# Patient Record
Sex: Male | Born: 1970
Health system: Southern US, Community
[De-identification: ages and names within clinical notes are randomized; demographics above are authoritative.]

## PROBLEM LIST (undated history)

## (undated) DIAGNOSIS — I10 Essential (primary) hypertension: Secondary | ICD-10-CM

## (undated) DIAGNOSIS — G44009 Cluster headache syndrome, unspecified, not intractable: Secondary | ICD-10-CM

## (undated) DIAGNOSIS — K219 Gastro-esophageal reflux disease without esophagitis: Secondary | ICD-10-CM

## (undated) DIAGNOSIS — A6000 Herpesviral infection of urogenital system, unspecified: Secondary | ICD-10-CM

## (undated) HISTORY — PX: VASECTOMY: SHX75

---

## 2004-07-02 ENCOUNTER — Ambulatory Visit: Payer: Self-pay | Admitting: Internal Medicine

## 2004-07-06 ENCOUNTER — Encounter: Admission: RE | Admit: 2004-07-06 | Discharge: 2004-07-06 | Payer: Self-pay | Admitting: Internal Medicine

## 2005-08-12 ENCOUNTER — Ambulatory Visit: Payer: Self-pay | Admitting: Internal Medicine

## 2005-08-15 ENCOUNTER — Encounter: Admission: RE | Admit: 2005-08-15 | Discharge: 2005-08-15 | Payer: Self-pay | Admitting: Internal Medicine

## 2005-09-11 ENCOUNTER — Ambulatory Visit: Payer: Self-pay | Admitting: Internal Medicine

## 2005-10-14 ENCOUNTER — Ambulatory Visit: Payer: Self-pay | Admitting: Internal Medicine

## 2006-02-21 ENCOUNTER — Ambulatory Visit: Payer: Self-pay | Admitting: Internal Medicine

## 2007-06-11 ENCOUNTER — Encounter (INDEPENDENT_AMBULATORY_CARE_PROVIDER_SITE_OTHER): Payer: Self-pay | Admitting: *Deleted

## 2009-12-21 ENCOUNTER — Emergency Department (HOSPITAL_COMMUNITY): Admission: EM | Admit: 2009-12-21 | Discharge: 2009-12-22 | Payer: Self-pay | Admitting: Emergency Medicine

## 2010-10-23 ENCOUNTER — Emergency Department (HOSPITAL_COMMUNITY)
Admission: EM | Admit: 2010-10-23 | Discharge: 2010-10-23 | Disposition: A | Discharge status: Home / Self Care | Payer: 59 | Attending: Emergency Medicine | Admitting: Emergency Medicine

## 2010-10-23 DIAGNOSIS — R1013 Epigastric pain: Secondary | ICD-10-CM | POA: Insufficient documentation

## 2010-10-23 LAB — COMPREHENSIVE METABOLIC PANEL
AST: 10 U/L (ref 0–37)
BUN: 18 mg/dL (ref 6–23)
CO2: 29 mEq/L (ref 19–32)
Chloride: 105 mEq/L (ref 96–112)
Creatinine, Ser: 0.89 mg/dL (ref 0.50–1.35)
GFR calc non Af Amer: >60 mL/min (ref >60)
Glucose, Bld: 125 mg/dL — ABNORMAL HIGH (ref 70–99)
Total Bilirubin: 0.2 mg/dL — ABNORMAL LOW (ref 0.3–1.2)

## 2010-10-23 LAB — DIFFERENTIAL
Lymphocytes Relative: 33 % (ref 12–46)
Monocytes Absolute: 0.6 10*3/uL (ref 0.1–1.0)
Monocytes Relative: 8 % (ref 3–12)
Neutro Abs: 4 10*3/uL (ref 1.7–7.7)

## 2010-10-23 LAB — URINALYSIS, ROUTINE W REFLEX MICROSCOPIC
Bilirubin Urine: NEGATIVE
Hgb urine dipstick: NEGATIVE
Ketones, ur: NEGATIVE mg/dL
Specific Gravity, Urine: 1.026 (ref 1.005–1.030)
pH: 6 (ref 5.0–8.0)

## 2010-10-23 LAB — CBC
HCT: 38 % — ABNORMAL LOW (ref 39.0–52.0)
Hemoglobin: 12.9 g/dL — ABNORMAL LOW (ref 13.0–17.0)
MCH: 28.5 pg (ref 26.0–34.0)
MCHC: 33.9 g/dL (ref 30.0–36.0)
MCV: 83.9 fL (ref 78.0–100.0)

## 2011-04-03 ENCOUNTER — Emergency Department (HOSPITAL_COMMUNITY)
Admission: EM | Admit: 2011-04-03 | Discharge: 2011-04-03 | Disposition: A | Discharge status: Home / Self Care | Payer: 59 | Source: Home / Self Care

## 2011-04-03 ENCOUNTER — Encounter (HOSPITAL_COMMUNITY): Payer: Self-pay | Admitting: *Deleted

## 2011-04-03 DIAGNOSIS — M79672 Pain in left foot: Secondary | ICD-10-CM

## 2011-04-03 DIAGNOSIS — M79609 Pain in unspecified limb: Secondary | ICD-10-CM

## 2011-04-03 MED ORDER — DICLOFENAC SODIUM 75 MG PO TBEC: 75.0000 mg | DELAYED_RELEASE_TABLET | Freq: Two times a day (BID) | ORAL | Status: AC

## 2011-04-03 NOTE — Discharge Instructions (Signed)
Ice your foot 3-4 times a day for 15-20 minutes or more often as needed. If your foot pain is not improving in one week call Dr Audrie Lia office for follow up , or you could consider follow up with the podiatrist of your choice. Return as needed.

## 2011-04-03 NOTE — ED Provider Notes (Signed)
History     CSN: [id number]  Arrival date & time 04/03/11  0909   None     Chief Complaint  Patient presents with  . Foot Pain    (Consider location/radiation/quality/duration/timing/severity/associated sxs/prior treatment) HPI Comments: Pt presents with c/o Lt foot pain. Pain is on the dorsum of his foot up to the anterior ankle and is worse at the end of the day after working and being on his feet all day, and also worse with plantarflexion. He denies injury. He has not tried anything for the discomfort. He has no previous hx of same. He is not diabetic.    History reviewed. No pertinent past medical history.  History reviewed. No pertinent past surgical history.  History reviewed. No pertinent family history.  History  Substance Use Topics  . Smoking status: Current Everyday Smoker  . Smokeless tobacco: Not on file  . Alcohol Use: Yes      Review of Systems  Musculoskeletal: Negative for joint swelling and gait problem.  Skin: Negative for color change, rash and wound.  Neurological: Negative for numbness.    Allergies  Review of patient's allergies indicates no known allergies.  Home Medications   Current Outpatient Rx  Name Route Sig Dispense Refill  . DICLOFENAC SODIUM 75 MG PO TBEC Oral Take 1 tablet (75 mg total) by mouth 2 (two) times daily. 20 tablet 0    BP 168/90  Pulse 80  Temp(Src) 99 F (37.2 C) (Oral)  Resp 16  SpO2 100%  Physical Exam  Nursing note and vitals reviewed. Constitutional: He appears well-developed and well-nourished. No distress.  HENT:  Head: Normocephalic and atraumatic.  Cardiovascular:  Pulses:      Dorsalis pedis pulses are 2+ on the left side.       Posterior tibial pulses are 2+ on the left side.  Musculoskeletal:       Left ankle: Normal.       Left foot: He exhibits tenderness. He exhibits normal range of motion, no bony tenderness, no swelling, normal capillary refill, no crepitus, no deformity and no  laceration.       Feet:  Neurological: He is alert.  Skin: Skin is warm and dry.  Psychiatric: He has a normal mood and affect.    ED Course  Procedures (including critical care time)  Labs Reviewed - No data to display No results found.   1. Foot pain, left       MDM  Lt foot pain x 1 wk. No injury. On exam, appears to be tendon tenderness, and not metatarsal bony tenderness. Pain reproduced with passive and active plantar flexion also.         Melody Comas, [place] 04/03/11 1040

## 2011-04-03 NOTE — ED Notes (Signed)
Left foot pain anterior foot denies injury mild pain when walking increased pain at night when trying to sleep

## 2011-04-05 NOTE — ED Provider Notes (Signed)
Medical screening examination/treatment/procedure(s) were performed by non-physician practitioner and as supervising physician I was immediately available for consultation/collaboration.  Kiani Wurtzel M. MD   Lauralyn Shadowens M Malary Aylesworth, MD 04/05/11 2120 

## 2012-09-17 ENCOUNTER — Emergency Department (HOSPITAL_COMMUNITY): Payer: 59

## 2012-09-17 ENCOUNTER — Emergency Department (HOSPITAL_COMMUNITY)
Admission: EM | Admit: 2012-09-17 | Discharge: 2012-09-18 | Disposition: A | Discharge status: Home / Self Care | Payer: 59 | Attending: Emergency Medicine | Admitting: Emergency Medicine

## 2012-09-17 ENCOUNTER — Encounter (HOSPITAL_COMMUNITY): Payer: Self-pay | Admitting: Adult Health

## 2012-09-17 DIAGNOSIS — N492 Inflammatory disorders of scrotum: Secondary | ICD-10-CM

## 2012-09-17 DIAGNOSIS — F17200 Nicotine dependence, unspecified, uncomplicated: Secondary | ICD-10-CM | POA: Insufficient documentation

## 2012-09-17 DIAGNOSIS — N498 Inflammatory disorders of other specified male genital organs: Secondary | ICD-10-CM | POA: Insufficient documentation

## 2012-09-17 NOTE — ED Provider Notes (Addendum)
CSN: [id number]     Arrival date & time 09/17/12  2044 History     First MD Initiated Contact with Patient 09/17/12 2336     Chief Complaint  Patient presents with  . Abscess   (Consider location/radiation/quality/duration/timing/severity/associated sxs/prior Treatment) HPI This is a 42 year old male with a history of scrotal abscesses. He is here with an abscess to his left proximal scrotum for about the past week. As of yesterday it was improving but worsened today. There is moderate to severe pain associated with it. The abscess was described as about the size of a golf ball on arrival. He was having an ultrasound of the scrotum as ordered in triage when the abscess began draining, relieving much of his discomfort as well as decrease in the size of the abscess.  History reviewed. No pertinent past medical history. History reviewed. No pertinent past surgical history. History reviewed. No pertinent family history. History  Substance Use Topics  . Smoking status: Current Every Day Smoker  . Smokeless tobacco: Not on file  . Alcohol Use: Yes    Review of Systems  All other systems reviewed and are negative.    Allergies  Review of patient's allergies indicates no known allergies.  Home Medications   Current Outpatient Rx  Name  Route  Sig  Dispense  Refill  . Aspirin-Salicylamide-Caffeine (BC HEADACHE POWDER PO)   Oral   Take 1 packet by mouth daily as needed (headache).          BP 194/101  Pulse 85  Temp(Src) 98.8 F (37.1 C) (Oral)  Resp 16  SpO2 98%  Physical Exam General: Well-developed, well-nourished male in no acute distress; appearance consistent with age of record HENT: normocephalic, atraumatic Eyes: pupils equal round and reactive to light; extraocular muscles intact Neck: supple Heart: regular rate and rhythm Lungs: clear to auscultation bilaterally Abdomen: soft; nondistended; nontender GU: Tanner 4 male; circumcised; abscess left proximal  scrotum with draining central punctum Extremities: No deformity; full range of motion; pulses normal Neurologic: Awake, alert and oriented; motor function intact in all extremities and symmetric; no facial droop Skin: Warm and dry Psychiatric: Normal mood and affect    ED Course   Procedures (including critical care time)  INCISION AND DRAINAGE Performed by: Paula Libra L Consent: Verbal consent obtained. Risks and benefits: risks, benefits and alternatives were discussed Type: abscess  Body area: Left hemiscrotum  Anesthesia: local infiltration  Incision was made with a scalpel.  Local anesthetic: lidocaine  2% with epinephrine  Anesthetic total: 0.5 ml  Complexity: complex Blunt dissection to break up loculations  Drainage: purulent  Drainage amount: Small   Packing material: 1/4 in iodoform gauze  Patient tolerance: Patient tolerated the procedure well with no immediate complications.   We'll treat with antibiotics as, per ultrasound, this is not a simple abscess.    MDM  Nursing notes and vitals signs, including pulse oximetry, reviewed.  Summary of this visit's results, reviewed by myself:  Imaging Studies: US Scrotum  09/17/2012   *RADIOLOGY REPORT*  Clinical Data: Scrotal abscess.  History of prior scrotal abscess.  ULTRASOUND OF SCROTUM  Technique:  Complete ultrasound examination of the testicles, epididymis, and other scrotal structures was performed.  Comparison:  08/15/2005.  Findings:  Right testis:  Normal echotexture.  40 mm x 24 mm x 26 mm.  Normal color flow.  Left testis:  Normal echotexture.  42 mm x 23 mm x 26 mm.  Normal color flow.  Right epididymis:  Tubal  ectasia.  Left epididymis:  Tubal ectasia.  Hydrocele:  Small left hydrocele is probably reactive.  Varicocele:  None.  There is a 2.8 cm x 0.8 cm x 1.9 cm subcutaneous abscess with surrounding phlegmon in the left anterior scrotum.  Surrounding scrotal wall edema.  By report, this abscess  may have ruptured following examination.  IMPRESSION: 1.  Normal appearance of the testicles and epididymis bilaterally. Small left hydrocele is probably reactive. 2.  Left scrotal abscess measuring 28 mm x 8 mm x 19 mm.   Original Report Authenticated By: Andreas Newport, M.D.      Hanley Seamen, MD 09/18/12 0000  Hanley Seamen, MD 09/18/12 1610

## 2012-09-17 NOTE — ED Notes (Signed)
Presents with induration to scrotum, began one week ago and was the size of a quarter, drainage noted yesterday of red and pink, today induration is the size of golf ball. Pt reports same symptoms before requiring surgery.

## 2012-09-18 MED ORDER — DOXYCYCLINE HYCLATE 100 MG IV SOLR
100.0000 mg | Freq: Once | INTRAVENOUS | Status: DC
  Filled 2012-09-18: qty 100

## 2012-09-18 MED ORDER — HYDROCODONE-ACETAMINOPHEN 5-325 MG PO TABS
1.0000 | ORAL_TABLET | Freq: Once | ORAL | Status: AC
  Administered 2012-09-18: 1 via ORAL
  Filled 2012-09-18: qty 1

## 2012-09-18 MED ORDER — HYDROCODONE-ACETAMINOPHEN 5-325 MG PO TABS: 1.0000 | ORAL_TABLET | ORAL | Status: DC | PRN

## 2012-09-18 MED ORDER — DOXYCYCLINE HYCLATE 100 MG PO CAPS: 100.0000 mg | ORAL_CAPSULE | Freq: Two times a day (BID) | ORAL | Status: DC

## 2012-09-18 MED ORDER — DOXYCYCLINE HYCLATE 100 MG PO TABS
100.0000 mg | ORAL_TABLET | Freq: Once | ORAL | Status: AC
  Administered 2012-09-18: 100 mg via ORAL
  Filled 2012-09-18: qty 1

## 2012-09-18 NOTE — ED Notes (Signed)
MD notified of bp. Instructed pt to follow up with PCP on Monday.

## 2013-01-25 ENCOUNTER — Emergency Department (HOSPITAL_COMMUNITY)
Admission: EM | Admit: 2013-01-25 | Discharge: 2013-01-25 | Disposition: A | Discharge status: Home / Self Care | Payer: 59 | Attending: Emergency Medicine | Admitting: Emergency Medicine

## 2013-01-25 ENCOUNTER — Emergency Department (HOSPITAL_COMMUNITY): Payer: 59

## 2013-01-25 ENCOUNTER — Encounter (HOSPITAL_COMMUNITY): Payer: Self-pay | Admitting: Emergency Medicine

## 2013-01-25 DIAGNOSIS — F17200 Nicotine dependence, unspecified, uncomplicated: Secondary | ICD-10-CM | POA: Insufficient documentation

## 2013-01-25 DIAGNOSIS — R0789 Other chest pain: Secondary | ICD-10-CM | POA: Diagnosis present

## 2013-01-25 LAB — CBC
MCV: 85.4 fL (ref 78.0–100.0)
Platelets: 256 10*3/uL (ref 150–400)
RDW: 13.9 % (ref 11.5–15.5)
WBC: 9 10*3/uL (ref 4.0–10.5)

## 2013-01-25 LAB — BASIC METABOLIC PANEL
Calcium: 9.1 mg/dL (ref 8.4–10.5)
Chloride: 101 mEq/L (ref 96–112)
Creatinine, Ser: 1.06 mg/dL (ref 0.50–1.35)
GFR calc Af Amer: >90 mL/min (ref >90)

## 2013-01-25 LAB — POCT I-STAT TROPONIN I: Troponin i, poc: 0.01 ng/mL (ref 0.00–0.08)

## 2013-01-25 IMAGING — CR DG CHEST 2V
2 series · 2 of 2 positions shown · non-contrast
Comparison: None.

CLINICAL DATA: Left chest pain

EXAM:
CHEST  2 VIEW

[w chest pa]
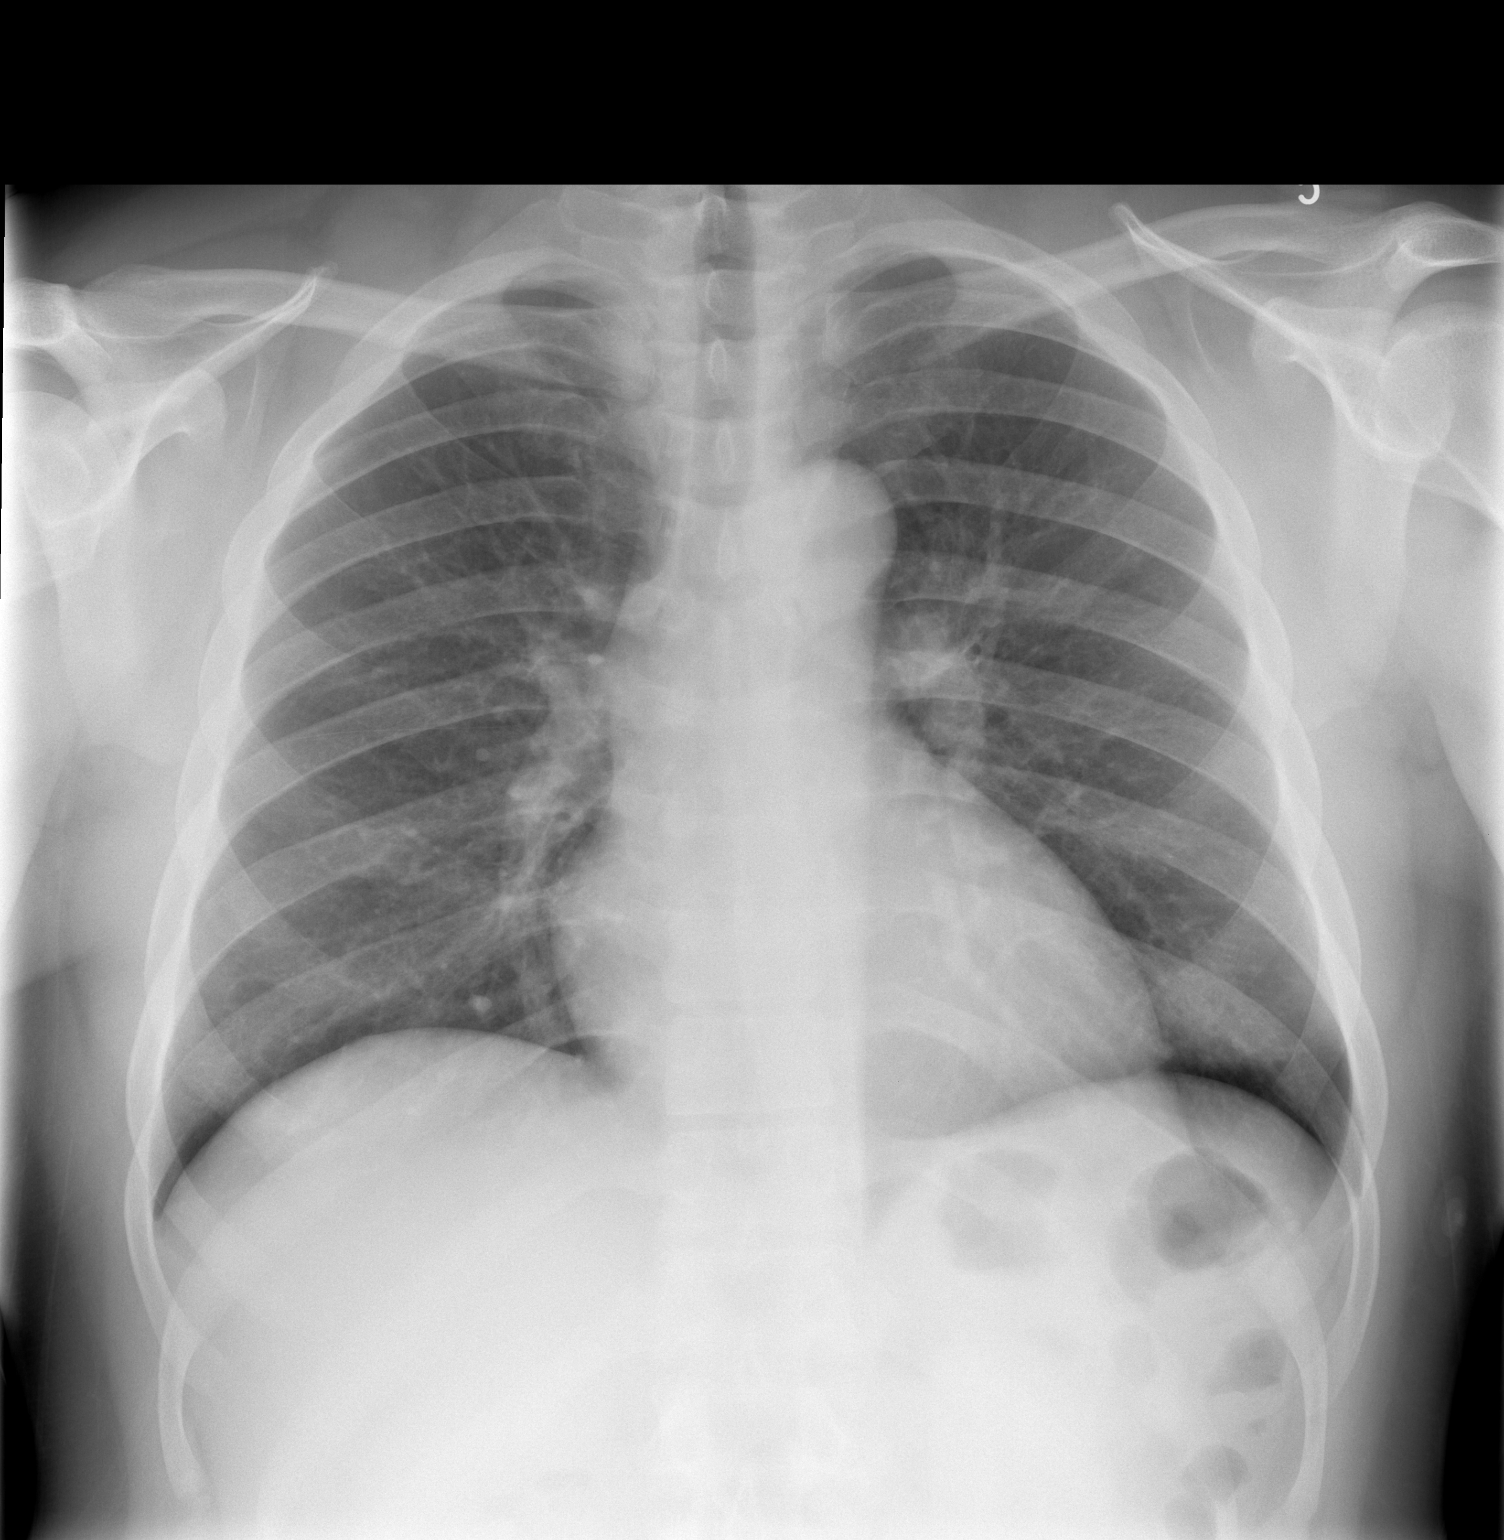

[w chest lat]
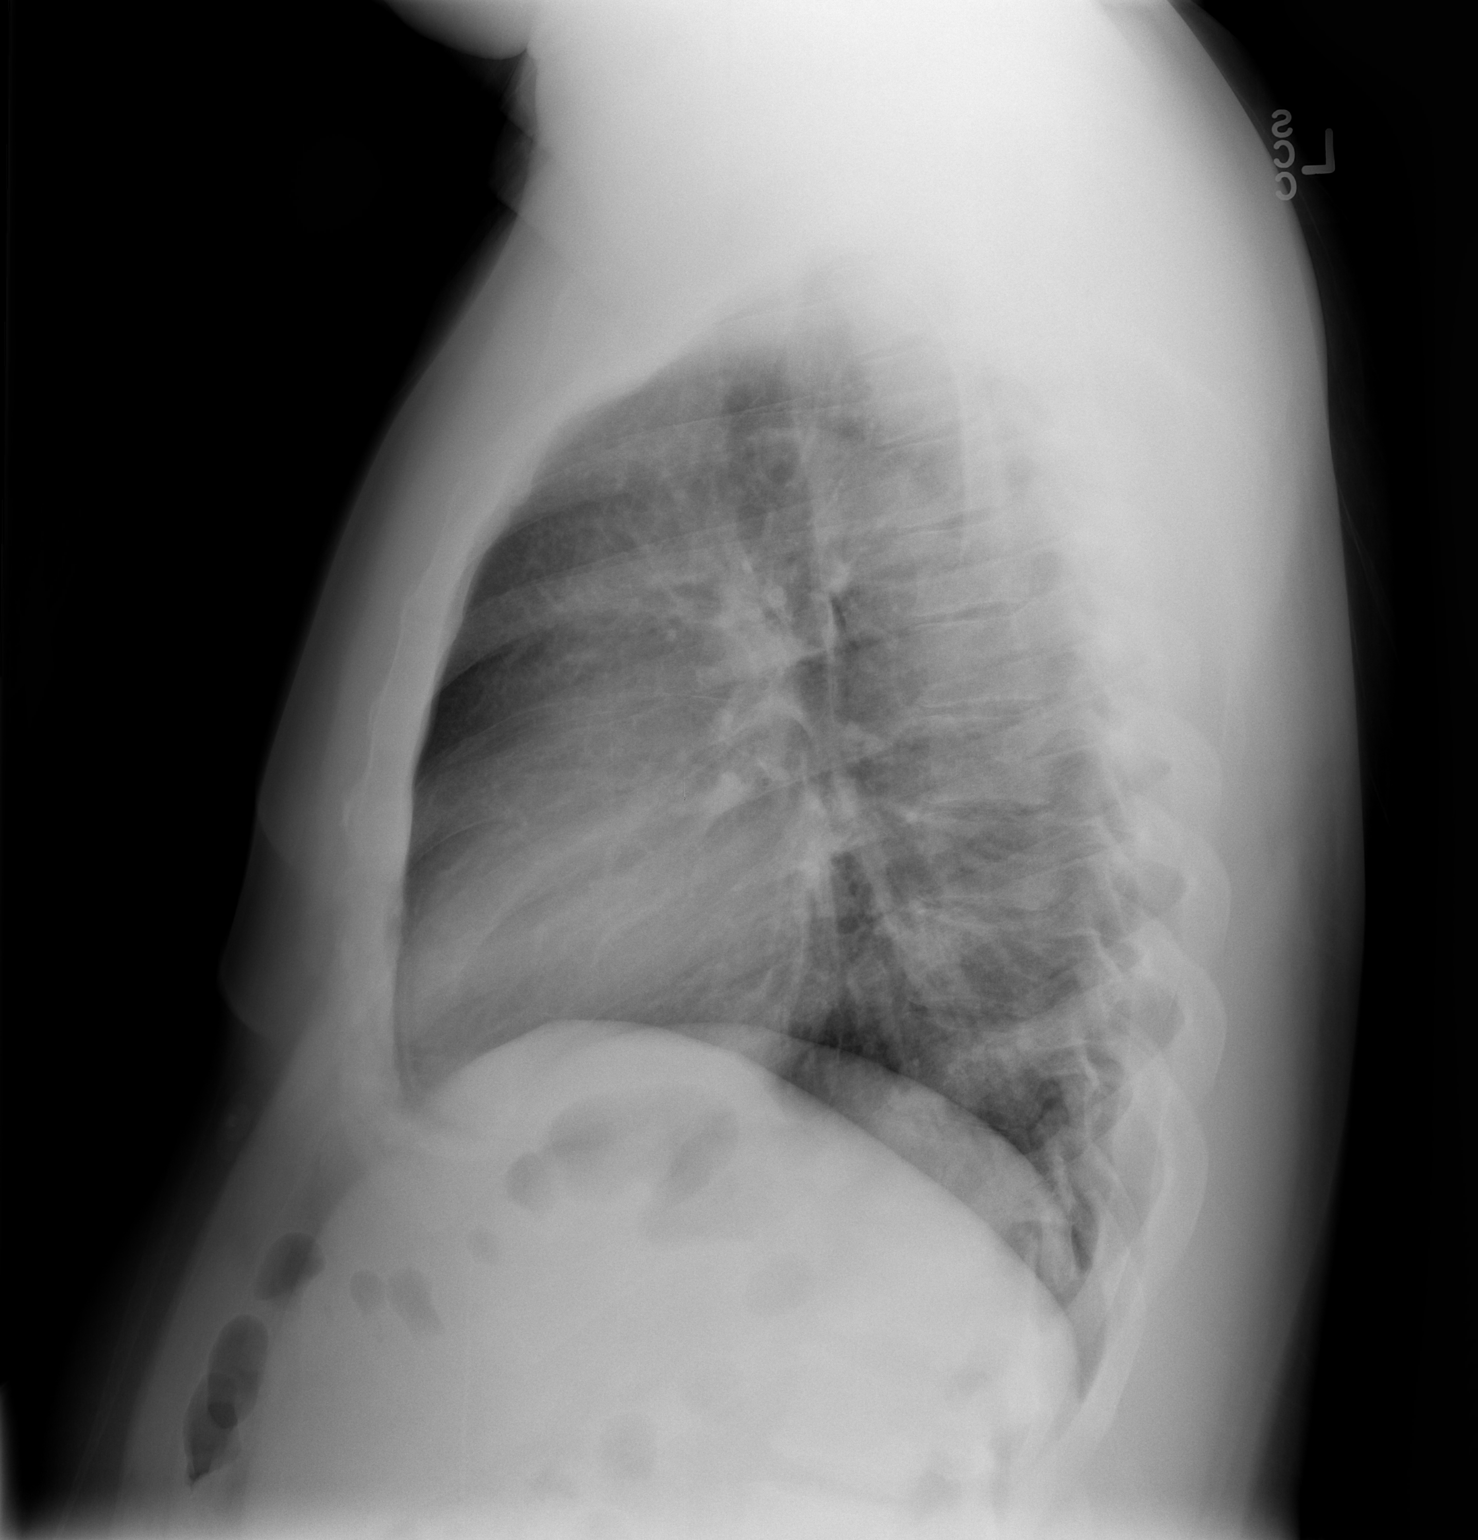

[2 of 2 positions shown; findings below may reference images not displayed]

FINDINGS: The heart size and mediastinal contours are within normal limits.
Both lungs are clear. The visualized skeletal structures are
unremarkable.
IMPRESSION: No active cardiopulmonary disease.

## 2013-01-25 MED ORDER — HYDROCHLOROTHIAZIDE 12.5 MG PO CAPS
12.5000 mg | ORAL_CAPSULE | ORAL | Status: AC
  Administered 2013-01-25: 12.5 mg via ORAL
  Filled 2013-01-25: qty 1

## 2013-01-25 MED ORDER — HYDROCHLOROTHIAZIDE 25 MG PO TABS: 12.5000 mg | ORAL_TABLET | Freq: Every day | ORAL | Status: DC

## 2013-01-25 NOTE — ED Provider Notes (Signed)
CSN: [id number]     Arrival date & time 01/25/13  1836 History   First MD Initiated Contact with Patient 01/25/13 1923     Chief Complaint  Patient presents with  . Chest Pain   (Consider location/radiation/quality/duration/timing/severity/associated sxs/prior Treatment) Patient is a 42 y.o. male presenting with chest pain. The history is provided by the patient.  Chest Pain Pain location:  L chest Pain quality: sharp and tightness   Pain radiates to:  Does not radiate Pain radiates to the back: no   Pain severity:  Mild Onset quality:  Sudden Duration:  10 minutes Timing:  Constant Progression:  Resolved Chronicity:  New Context: at rest   Relieved by:  Nothing Worsened by:  Nothing tried Ineffective treatments:  None tried Associated symptoms: no abdominal pain, no cough, no fever, no headache, no nausea, no numbness, no shortness of breath and not vomiting     History reviewed. No pertinent past medical history. History reviewed. No pertinent past surgical history. No family history on file. History  Substance Use Topics  . Smoking status: Current Every Day Smoker  . Smokeless tobacco: Not on file  . Alcohol Use: Yes    Review of Systems  Constitutional: Negative for fever.  HENT: Negative for drooling and rhinorrhea.   Eyes: Negative for pain.  Respiratory: Negative for cough and shortness of breath.   Cardiovascular: Positive for chest pain. Negative for leg swelling.  Gastrointestinal: Negative for nausea, vomiting, abdominal pain and diarrhea.  Genitourinary: Negative for dysuria and hematuria.  Musculoskeletal: Negative for gait problem and neck pain.  Skin: Negative for color change.  Neurological: Negative for numbness and headaches.  Hematological: Negative for adenopathy.  Psychiatric/Behavioral: Negative for behavioral problems.  All other systems reviewed and are negative.    Allergies  Review of patient's allergies indicates no known  allergies.  Home Medications   Current Outpatient Rx  Name  Route  Sig  Dispense  Refill  . valACYclovir (VALTREX) 500 MG tablet   Oral   Take 500 mg by mouth daily as needed.          BP 196/131  Pulse 74  Temp(Src) 98.1 F (36.7 C) (Oral)  Resp 20  Ht 6' (1.829 m)  Wt 249 lb 9 oz (113.201 kg)  BMI 33.84 kg/m2  SpO2 98% Physical Exam  Nursing note and vitals reviewed. Constitutional: He is oriented to person, place, and time. He appears well-developed and well-nourished.  HENT:  Head: Normocephalic and atraumatic.  Right Ear: External ear normal.  Left Ear: External ear normal.  Nose: Nose normal.  Mouth/Throat: Oropharynx is clear and moist. No oropharyngeal exudate.  Eyes: Conjunctivae and EOM are normal. Pupils are equal, round, and reactive to light.  Neck: Normal range of motion. Neck supple.  Cardiovascular: Normal rate, regular rhythm, normal heart sounds and intact distal pulses.  Exam reveals no gallop and no friction rub.   No murmur heard. Pulmonary/Chest: Effort normal and breath sounds normal. No respiratory distress. He has no wheezes.  Abdominal: Soft. Bowel sounds are normal. He exhibits no distension. There is no tenderness. There is no rebound and no guarding.  Musculoskeletal: Normal range of motion. He exhibits no edema and no tenderness.  Neurological: He is alert and oriented to person, place, and time.  Skin: Skin is warm and dry.  Psychiatric: He has a normal mood and affect. His behavior is normal.    ED Course  Procedures (including critical care time) Labs Review Labs Reviewed  BASIC METABOLIC PANEL - Abnormal; Notable for the following:    GFR calc non Af Amer 86 (*)    All other components within normal limits  CBC  POCT I-STAT TROPONIN I   Imaging Review Dg Chest 2 View  01/25/2013   CLINICAL DATA:  Left chest pain  EXAM: CHEST  2 VIEW  COMPARISON:  None.  FINDINGS: The heart size and mediastinal contours are within normal limits.  Both lungs are clear. The visualized skeletal structures are unremarkable.  IMPRESSION: No active cardiopulmonary disease.   Electronically Signed   By: Ruel Favors M.D.   On: 01/25/2013 19:25    EKG Interpretation    Date/Time:  Monday January 25 2013 18:39:09 EST Ventricular Rate:  65 PR Interval:  142 QRS Duration: 86 QT Interval:  396 QTC Calculation: 411 R Axis:   92 Text Interpretation:  Normal sinus rhythm Rightward axis Non-specific t wave abnormalities Confirmed by Marilyn Wing  MD, Zakyla Tonche ([id number]) on 01/25/2013 8:13:16 PM            MDM   1. Atypical chest pain    8:15 PM 42 y.o. male who presents with left-sided chest tightness and sharp pains. He states that his symptoms began while driving his car at approximately 6 PM. His symptoms lasted 10 minutes and resolved spontaneously. He is afebrile and hypertensive on exam here. He is currently asymptomatic. He denies any history of exertional chest pain and states that he has had this similar pain once before. Wells/Perc neg. Atypical for cardiac. RF's include FH, smoker, HTN. Initial labs non-contrib, ecg w/ non-spec t wave changes. Pt is low risk for MACE per HEART score. Will start HCTZ to control BP and have pt f/u w/ pcp. Will give first dose here.   8:18 PM:  I have discussed the diagnosis/risks/treatment options with the patient and family and believe the pt to be eligible for discharge home to follow-up with pcp within the next week to discuss BP control and possible stress testing. We also discussed returning to the ED immediately if new or worsening sx occur. We discussed the sx which are most concerning (e.g., return of cp, sob, fever) that necessitate immediate return. Any new prescriptions provided to the patient are listed below.  Discharge Medication List as of 01/25/2013  8:37 PM    START taking these medications   Details  hydrochlorothiazide (HYDRODIURIL) 25 MG tablet Take 0.5 tablets (12.5 mg total) by mouth  daily., Starting 01/25/2013, Until Discontinued, Print           Junius Argyle, MD 01/26/13 1359

## 2013-01-25 NOTE — ED Notes (Signed)
Pt. Alert and oriented x4. Denies chest pain at this time.

## 2013-01-25 NOTE — ED Notes (Signed)
Pt states had sudden onset of chest pain to left without radiation while at rest.  No sob or diaphoretic and lasted only 10 minutes.

## 2013-11-01 ENCOUNTER — Encounter (HOSPITAL_COMMUNITY): Payer: Self-pay | Admitting: Emergency Medicine

## 2013-11-01 ENCOUNTER — Emergency Department (HOSPITAL_COMMUNITY)
Admission: EM | Admit: 2013-11-01 | Discharge: 2013-11-01 | Disposition: A | Discharge status: Home / Self Care | Payer: 59 | Attending: Emergency Medicine | Admitting: Emergency Medicine

## 2013-11-01 ENCOUNTER — Emergency Department (HOSPITAL_COMMUNITY): Payer: 59

## 2013-11-01 DIAGNOSIS — Z79899 Other long term (current) drug therapy: Secondary | ICD-10-CM | POA: Insufficient documentation

## 2013-11-01 DIAGNOSIS — I10 Essential (primary) hypertension: Secondary | ICD-10-CM | POA: Insufficient documentation

## 2013-11-01 DIAGNOSIS — J40 Bronchitis, not specified as acute or chronic: Secondary | ICD-10-CM | POA: Diagnosis not present

## 2013-11-01 DIAGNOSIS — F17200 Nicotine dependence, unspecified, uncomplicated: Secondary | ICD-10-CM | POA: Diagnosis not present

## 2013-11-01 DIAGNOSIS — R05 Cough: Secondary | ICD-10-CM | POA: Diagnosis present

## 2013-11-01 DIAGNOSIS — R059 Cough, unspecified: Secondary | ICD-10-CM | POA: Diagnosis present

## 2013-11-01 HISTORY — DX: Essential (primary) hypertension: I10

## 2013-11-01 MED ORDER — BENZONATATE 100 MG PO CAPS
200.0000 mg | ORAL_CAPSULE | Freq: Three times a day (TID) | ORAL | Status: DC | PRN
  Administered 2013-11-01: 200 mg via ORAL
  Filled 2013-11-01: qty 2

## 2013-11-01 MED ORDER — ALBUTEROL SULFATE HFA 108 (90 BASE) MCG/ACT IN AERS
1.0000 | INHALATION_SPRAY | RESPIRATORY_TRACT | Status: DC | PRN
  Administered 2013-11-01: 2 via RESPIRATORY_TRACT
  Filled 2013-11-01: qty 6.7

## 2013-11-01 MED ORDER — ALBUTEROL SULFATE HFA 108 (90 BASE) MCG/ACT IN AERS: 1.0000 | INHALATION_SPRAY | RESPIRATORY_TRACT | Status: DC | PRN

## 2013-11-01 MED ORDER — BENZONATATE 200 MG PO CAPS: 200.0000 mg | ORAL_CAPSULE | Freq: Three times a day (TID) | ORAL | Status: DC | PRN

## 2013-11-01 MED ORDER — HYDROCOD POLST-CHLORPHEN POLST 10-8 MG/5ML PO LQCR: 5.0000 mL | Freq: Two times a day (BID) | ORAL | Status: DC | PRN

## 2013-11-01 NOTE — ED Provider Notes (Signed)
CSN: [id number]     Arrival date & time 11/01/13  0044 History   First MD Initiated Contact with Patient 11/01/13 0107     Chief Complaint  Patient presents with  . Cough     (Consider location/radiation/quality/duration/timing/severity/associated sxs/prior Treatment) HPI 43 year old male presents to emergency department with complaint of upper respiratory symptoms for one week, cough for 2 days.  Patient is a half pack to a full pack a day smoker.  Patient has not taken his blood pressure medicines this week as he did not want to mix them with the cold medications that he was taking.  He denies any fever or chills.  He reports aching throughout his chest secondary to cough.  He reports is unable to sleep well secondary to cough Past Medical History  Diagnosis Date  . Hypertension    History reviewed. No pertinent past surgical history. No family history on file. History  Substance Use Topics  . Smoking status: Current Every Day Smoker  . Smokeless tobacco: Not on file  . Alcohol Use: Yes    Review of Systems   See History of Present Illness; otherwise all other systems are reviewed and negative  Allergies  Review of patient's allergies indicates no known allergies.  Home Medications   Prior to Admission medications   Medication Sig Start Date End Date Taking? Authorizing Provider  albuterol (PROVENTIL HFA;VENTOLIN HFA) 108 (90 BASE) MCG/ACT inhaler Inhale 1-2 puffs into the lungs every 4 (four) hours as needed for wheezing or shortness of breath. 11/01/13   Kalman Drape, MD  benzonatate (TESSALON) 200 MG capsule Take 1 capsule (200 mg total) by mouth 3 (three) times daily as needed for cough. 11/01/13   Kalman Drape, MD  chlorpheniramine-HYDROcodone (TUSSIONEX PENNKINETIC ER) 10-8 MG/5ML LQCR Take 5 mLs by mouth every 12 (twelve) hours as needed for cough. 11/01/13   Kalman Drape, MD  hydrochlorothiazide (HYDRODIURIL) 25 MG tablet Take 0.5 tablets (12.5 mg total) by mouth daily.  01/25/13   Pamella Pert, MD  valACYclovir (VALTREX) 500 MG tablet Take 500 mg by mouth daily as needed.    Historical Provider, MD   BP 173/94  Pulse 65  Temp(Src) 97.9 F (36.6 C) (Oral)  Resp 16  Ht 5\' 10"  (1.778 m)  Wt 248 lb (112.492 kg)  BMI 35.58 kg/m2  SpO2 98% Physical Exam  Nursing note and vitals reviewed. Constitutional: He is oriented to person, place, and time. He appears well-developed and well-nourished. No distress.  HENT:  Head: Normocephalic and atraumatic.  Nose: Nose normal.  Mouth/Throat: Oropharynx is clear and moist.  Eyes: Conjunctivae and EOM are normal. Pupils are equal, round, and reactive to light.  Neck: Normal range of motion. Neck supple. No JVD present. No tracheal deviation present. No thyromegaly present.  Cardiovascular: Normal rate, regular rhythm, normal heart sounds and intact distal pulses.  Exam reveals no gallop and no friction rub.   No murmur heard. Pulmonary/Chest: Effort normal. No stridor. No respiratory distress. He has wheezes (mild end expiratory wheeze). He has no rales. He exhibits no tenderness.  Cough   Abdominal: Soft. Bowel sounds are normal. He exhibits no distension and no mass. There is no tenderness. There is no rebound and no guarding.  Musculoskeletal: Normal range of motion. He exhibits no edema and no tenderness.  Lymphadenopathy:    He has no cervical adenopathy.  Neurological: He is alert and oriented to person, place, and time. He displays normal reflexes. He exhibits normal muscle  tone. Coordination normal.  Skin: Skin is warm and dry. No rash noted. No erythema. No pallor.  Psychiatric: He has a normal mood and affect. His behavior is normal. Judgment and thought content normal.    ED Course  Procedures (including critical care time) Labs Review Labs Reviewed - No data to display  Imaging Review Dg Chest 2 View  11/01/2013   CLINICAL DATA:  Cough and chest pain.  EXAM: CHEST  2 VIEW  COMPARISON:  Chest  radiograph performed 01/25/2013  FINDINGS: The lungs are well-aerated. Peribronchial thickening is noted. There is no evidence of focal opacification, pleural effusion or pneumothorax.  The heart is normal in size; the mediastinal contour is within normal limits. Slight hilar prominence is stable from the prior study and likely within normal limits. No acute osseous abnormalities are seen.  IMPRESSION: Peribronchial thickening noted; lungs otherwise grossly clear.   Electronically Signed   By: Garald Balding M.D.   On: 11/01/2013 01:36     EKG Interpretation None      MDM   Final diagnoses:  Bronchitis    43 year old male with what appears to be bronchitis.  Chest x-ray with some peribronchial thickening.  We'll place on albuterol, Tessalon and Tussionex.  Patient instructed that he needs to get back on his blood pressure medications.    Kalman Drape, MD 11/01/13 [phone]

## 2013-11-01 NOTE — Discharge Instructions (Signed)
How to Use an Inhaler Using your inhaler correctly is very important. Good technique will make sure that the medicine reaches your lungs.  HOW TO USE AN INHALER: 1. Take the cap off the inhaler. 2. If this is the first time using your inhaler, you need to prime it. Shake the inhaler for 5 seconds. Release four puffs into the air, away from your face. Ask your doctor for help if you have questions. 3. Shake the inhaler for 5 seconds. 4. Turn the inhaler so the bottle is above the mouthpiece. 5. Put your pointer finger on top of the bottle. Your thumb holds the bottom of the inhaler. 6. Open your mouth. 7. Either hold the inhaler away from your mouth (the width of 2 fingers) or place your lips tightly around the mouthpiece. Ask your doctor which way to use your inhaler. 8. Breathe out as much air as possible. 9. Breathe in and push down on the bottle 1 time to release the medicine. You will feel the medicine go in your mouth and throat. 10. Continue to take a deep breath in very slowly. Try to fill your lungs. 11. After you have breathed in completely, hold your breath for 10 seconds. This will help the medicine to settle in your lungs. If you cannot hold your breath for 10 seconds, hold it for as long as you can before you breathe out. 12. Breathe out slowly, through pursed lips. Whistling is an example of pursed lips. 13. If your doctor has told you to take more than 1 puff, wait at least 15-30 seconds between puffs. This will help you get the best results from your medicine. Do not use the inhaler more than your doctor tells you to. 14. Put the cap back on the inhaler. 15. Follow the directions from your doctor or from the inhaler package about cleaning the inhaler. If you use more than one inhaler, ask your doctor which inhalers to use and what order to use them in. Ask your doctor to help you figure out when you will need to refill your inhaler.  If you use a steroid inhaler, always rinse your  mouth with water after your last puff, gargle and spit out the water. Do not swallow the water. GET HELP IF:  The inhaler medicine only partially helps to stop wheezing or shortness of breath.  You are having trouble using your inhaler.  You have some increase in thick spit (phlegm). GET HELP RIGHT AWAY IF:  The inhaler medicine does not help your wheezing or shortness of breath or you have tightness in your chest.  You have dizziness, headaches, or fast heart rate.  You have chills, fever, or night sweats.  You have a large increase of thick spit, or your thick spit is bloody. MAKE SURE YOU:   Understand these instructions.  Will watch your condition.  Will get help right away if you are not doing well or get worse. Document Released: 11/14/2007 Document Revised: 11/25/2012 Document Reviewed: 09/03/2012 [facility] Patient Information 2015 [place], [place]. This information is not intended to replace advice given to you by your health care provider. Make sure you discuss any questions you have with your health care provider.  Upper Respiratory Infection, Adult An upper respiratory infection (URI) is also sometimes known as the common cold. The upper respiratory tract includes the nose, sinuses, throat, trachea, and bronchi. Bronchi are the airways leading to the lungs. Most people improve within 1 week, but symptoms can last up to  2 weeks. A residual cough may last even longer.  CAUSES Many different viruses can infect the tissues lining the upper respiratory tract. The tissues become irritated and inflamed and often become very moist. Mucus production is also common. A cold is contagious. You can easily spread the virus to others by oral contact. This includes kissing, sharing a glass, coughing, or sneezing. Touching your mouth or nose and then touching a surface, which is then touched by another person, can also spread the virus. SYMPTOMS  Symptoms typically develop 1 to 3 days after  you come in contact with a cold virus. Symptoms vary from person to person. They may include:  Runny nose.  Sneezing.  Nasal congestion.  Sinus irritation.  Sore throat.  Loss of voice (laryngitis).  Cough.  Fatigue.  Muscle aches.  Loss of appetite.  Headache.  Low-grade fever. DIAGNOSIS  You might diagnose your own cold based on familiar symptoms, since most people get a cold 2 to 3 times a year. Your caregiver can confirm this based on your exam. Most importantly, your caregiver can check that your symptoms are not due to another disease such as strep throat, sinusitis, pneumonia, asthma, or epiglottitis. Blood tests, throat tests, and X-rays are not necessary to diagnose a common cold, but they may sometimes be helpful in excluding other more serious diseases. Your caregiver will decide if any further tests are required. RISKS AND COMPLICATIONS  You may be at risk for a more severe case of the common cold if you smoke cigarettes, have chronic heart disease (such as heart failure) or lung disease (such as asthma), or if you have a weakened immune system. The very young and very old are also at risk for more serious infections. Bacterial sinusitis, middle ear infections, and bacterial pneumonia can complicate the common cold. The common cold can worsen asthma and chronic obstructive pulmonary disease (COPD). Sometimes, these complications can require emergency medical care and may be life-threatening. PREVENTION  The best way to protect against getting a cold is to practice good hygiene. Avoid oral or hand contact with people with cold symptoms. Wash your hands often if contact occurs. There is no clear evidence that vitamin C, vitamin E, echinacea, or exercise reduces the chance of developing a cold. However, it is always recommended to get plenty of rest and practice good nutrition. TREATMENT  Treatment is directed at relieving symptoms. There is no cure. Antibiotics are not  effective, because the infection is caused by a virus, not by bacteria. Treatment may include:  Increased fluid intake. Sports drinks offer valuable electrolytes, sugars, and fluids.  Breathing heated mist or steam (vaporizer or shower).  Eating chicken soup or other clear broths, and maintaining good nutrition.  Getting plenty of rest.  Using gargles or lozenges for comfort.  Controlling fevers with ibuprofen or acetaminophen as directed by your caregiver.  Increasing usage of your inhaler if you have asthma. Zinc gel and zinc lozenges, taken in the first 24 hours of the common cold, can shorten the duration and lessen the severity of symptoms. Pain medicines may help with fever, muscle aches, and throat pain. A variety of non-prescription medicines are available to treat congestion and runny nose. Your caregiver can make recommendations and may suggest nasal or lung inhalers for other symptoms.  HOME CARE INSTRUCTIONS   Only take over-the-counter or prescription medicines for pain, discomfort, or fever as directed by your caregiver.  Use a warm mist humidifier or inhale steam from a shower to  increase air moisture. This may keep secretions moist and make it easier to breathe.  Drink enough water and fluids to keep your urine clear or pale yellow.  Rest as needed.  Return to work when your temperature has returned to normal or as your caregiver advises. You may need to stay home longer to avoid infecting others. You can also use a face mask and careful hand washing to prevent spread of the virus. SEEK MEDICAL CARE IF:   After the first few days, you feel you are getting worse rather than better.  You need your caregiver's advice about medicines to control symptoms.  You develop chills, worsening shortness of breath, or brown or red sputum. These may be signs of pneumonia.  You develop yellow or brown nasal discharge or pain in the face, especially when you bend forward. These may  be signs of sinusitis.  You develop a fever, swollen neck glands, pain with swallowing, or white areas in the back of your throat. These may be signs of strep throat. SEEK IMMEDIATE MEDICAL CARE IF:   You have a fever.  You develop severe or persistent headache, ear pain, sinus pain, or chest pain.  You develop wheezing, a prolonged cough, cough up blood, or have a change in your usual mucus (if you have chronic lung disease).  You develop sore muscles or a stiff neck. Document Released: 07/31/2000 Document Revised: 04/29/2011 Document Reviewed: 05/12/2013 [facility] Patient Information 2015 [place], [place]. This information is not intended to replace advice given to you by your health care provider. Make sure you discuss any questions you have with your health care provider.

## 2013-11-01 NOTE — ED Notes (Signed)
The pt has not had his bp meds this weekend

## 2013-11-01 NOTE — ED Notes (Addendum)
Pt c/o cough x 1 week. Started on Wednesday, then progressed into a cold. Reports productive cough with yellow phlegm and having chills at home.  Pt has not taken blood pressure medications since Thursday; reports feeling "too bad and taking too much other cold medications, I didn't want to take too much." Also c/o back and chest pain "from coughing so much"

## 2013-11-01 NOTE — ED Notes (Signed)
The pt has had a cold for one week .  For the past 2 days he has had a cough productive cough yellow thick.  Maybe a temp  He is a smoker

## 2014-05-12 ENCOUNTER — Emergency Department (HOSPITAL_COMMUNITY)
Admission: EM | Admit: 2014-05-12 | Discharge: 2014-05-12 | Disposition: A | Discharge status: Home / Self Care | Payer: 59 | Attending: Emergency Medicine | Admitting: Emergency Medicine

## 2014-05-12 ENCOUNTER — Encounter (HOSPITAL_COMMUNITY): Payer: Self-pay | Admitting: *Deleted

## 2014-05-12 ENCOUNTER — Emergency Department (HOSPITAL_COMMUNITY): Payer: 59

## 2014-05-12 ENCOUNTER — Encounter (HOSPITAL_COMMUNITY): Payer: Self-pay | Admitting: General Practice

## 2014-05-12 ENCOUNTER — Emergency Department (HOSPITAL_COMMUNITY)
Admission: EM | Admit: 2014-05-12 | Discharge: 2014-05-12 | Disposition: A | Discharge status: Home / Self Care | Payer: 59 | Source: Home / Self Care | Attending: Family Medicine | Admitting: Family Medicine

## 2014-05-12 DIAGNOSIS — R55 Syncope and collapse: Secondary | ICD-10-CM | POA: Insufficient documentation

## 2014-05-12 DIAGNOSIS — I10 Essential (primary) hypertension: Secondary | ICD-10-CM | POA: Diagnosis not present

## 2014-05-12 DIAGNOSIS — Z79899 Other long term (current) drug therapy: Secondary | ICD-10-CM | POA: Diagnosis not present

## 2014-05-12 DIAGNOSIS — R110 Nausea: Secondary | ICD-10-CM

## 2014-05-12 DIAGNOSIS — R42 Dizziness and giddiness: Secondary | ICD-10-CM | POA: Diagnosis not present

## 2014-05-12 DIAGNOSIS — R197 Diarrhea, unspecified: Secondary | ICD-10-CM | POA: Diagnosis not present

## 2014-05-12 DIAGNOSIS — R9431 Abnormal electrocardiogram [ECG] [EKG]: Secondary | ICD-10-CM | POA: Diagnosis not present

## 2014-05-12 DIAGNOSIS — Z720 Tobacco use: Secondary | ICD-10-CM | POA: Insufficient documentation

## 2014-05-12 LAB — CBC WITH DIFFERENTIAL/PLATELET
Basophils Absolute: 0 10*3/uL (ref 0.0–0.1)
Basophils Relative: 1 % (ref 0–1)
EOS PCT: 6 % — AB (ref 0–5)
Eosinophils Absolute: 0.4 10*3/uL (ref 0.0–0.7)
HEMATOCRIT: 43 % (ref 39.0–52.0)
Hemoglobin: 14.4 g/dL (ref 13.0–17.0)
LYMPHS ABS: 3.1 10*3/uL (ref 0.7–4.0)
Lymphocytes Relative: 43 % (ref 12–46)
MCH: 28.9 pg (ref 26.0–34.0)
MCHC: 33.5 g/dL (ref 30.0–36.0)
MCV: 86.3 fL (ref 78.0–100.0)
MONO ABS: 0.5 10*3/uL (ref 0.1–1.0)
Monocytes Relative: 7 % (ref 3–12)
NEUTROS ABS: 3.1 10*3/uL (ref 1.7–7.7)
Neutrophils Relative %: 43 % (ref 43–77)
PLATELETS: 253 10*3/uL (ref 150–400)
RBC: 4.98 MIL/uL (ref 4.22–5.81)
RDW: 13.8 % (ref 11.5–15.5)
WBC: 7.1 10*3/uL (ref 4.0–10.5)

## 2014-05-12 LAB — POCT URINALYSIS DIP (DEVICE)
Bilirubin Urine: NEGATIVE
Glucose, UA: NEGATIVE mg/dL
Hgb urine dipstick: NEGATIVE
KETONES UR: NEGATIVE mg/dL
Leukocytes, UA: NEGATIVE
Nitrite: NEGATIVE
PH: 6.5 (ref 5.0–8.0)
PROTEIN: NEGATIVE mg/dL
SPECIFIC GRAVITY, URINE: 1.01 (ref 1.005–1.030)
UROBILINOGEN UA: 0.2 mg/dL (ref 0.0–1.0)

## 2014-05-12 LAB — BASIC METABOLIC PANEL
Anion gap: 8 (ref 5–15)
BUN: 10 mg/dL (ref 6–23)
CHLORIDE: 98 mmol/L (ref 96–112)
CO2: 30 mmol/L (ref 19–32)
Calcium: 9.2 mg/dL (ref 8.4–10.5)
Creatinine, Ser: 1.12 mg/dL (ref 0.50–1.35)
GFR calc Af Amer: >90 mL/min (ref >90)
GFR, EST NON AFRICAN AMERICAN: 80 mL/min — AB (ref >90)
GLUCOSE: 101 mg/dL — AB (ref 70–99)
POTASSIUM: 3.8 mmol/L (ref 3.5–5.1)
Sodium: 136 mmol/L (ref 135–145)

## 2014-05-12 LAB — TROPONIN I: Troponin I: <0.03 ng/mL (ref <0.031)

## 2014-05-12 MED ORDER — ASPIRIN 81 MG PO CHEW
324.0000 mg | CHEWABLE_TABLET | Freq: Once | ORAL | Status: AC
  Administered 2014-05-12: 324 mg via ORAL

## 2014-05-12 MED ORDER — SODIUM CHLORIDE 0.9 % IV SOLN
INTRAVENOUS | Status: DC
  Administered 2014-05-12: 15:00:00 via INTRAVENOUS

## 2014-05-12 MED ORDER — ASPIRIN 81 MG PO CHEW: 81.0000 mg | CHEWABLE_TABLET | Freq: Every day | ORAL | Status: DC

## 2014-05-12 MED ORDER — ASPIRIN 81 MG PO CHEW
CHEWABLE_TABLET | ORAL | Status: AC
  Filled 2014-05-12: qty 4

## 2014-05-12 NOTE — ED Notes (Signed)
Pt  Reports  Had  An  Episode  This  Am   Wherein  He  Became  Dizzy  Nauseated   And  Had  Diarrhea       -    He  reorts  Some  Low  abd  Pain  As  Well    Pt did  Not  Pass  Out  He  Is  Sitting  Upright on the  Exam table  Speaking in  Complete  sentances   And  Is  In no  Acute  Distress

## 2014-05-12 NOTE — ED Notes (Signed)
Pt gone for xray

## 2014-05-12 NOTE — ED Notes (Signed)
Pt was brought to ED from urgent care via [facility]. Around 0630 this morning pt had an episode of feeling really weak, dizzy, and diaphoretic. Pt episode lasted about 5 minutes. Pt transferred to ED to do further workup. Pt is A/O. All symptoms have since resolved. Pt has a history of hypertension. Pt received 1 324 mg aspirin. [facility] V/S 146/*96, HR 59, RR 18, SPO2 99%.

## 2014-05-12 NOTE — Discharge Instructions (Signed)
Near-Syncope Near-syncope (commonly known as near fainting) is sudden weakness, dizziness, or feeling like you might pass out. During an episode of near-syncope, you may also develop pale skin, have tunnel vision, or feel sick to your stomach (nauseous). Near-syncope may occur when getting up after sitting or while standing for a long time. It is caused by a sudden decrease in blood flow to the brain. This decrease can result from various causes or triggers, most of which are not serious. However, because near-syncope can sometimes be a sign of something serious, a medical evaluation is required. The specific cause is often not determined. HOME CARE INSTRUCTIONS  Monitor your condition for any changes. The following actions may help to alleviate any discomfort you are experiencing:  Have someone stay with you until you feel stable.  Lie down right away and prop your feet up if you start feeling like you might faint. Breathe deeply and steadily. Wait until all the symptoms have passed. Most of these episodes last only a few minutes. You may feel tired for several hours.   Drink enough fluids to keep your urine clear or pale yellow.   If you are taking blood pressure or heart medicine, get up slowly when seated or lying down. Take several minutes to sit and then stand. This can reduce dizziness.  Follow up with your health care provider as directed. SEEK IMMEDIATE MEDICAL CARE IF:   You have a severe headache.   You have unusual pain in the chest, abdomen, or back.   You are bleeding from the mouth or rectum, or you have black or tarry stool.   You have an irregular or very fast heartbeat.   You have repeated fainting or have seizure-like jerking during an episode.   You faint when sitting or lying down.   You have confusion.   You have difficulty walking.   You have severe weakness.   You have vision problems.  MAKE SURE YOU:   Understand these instructions.  Will  watch your condition.  Will get help right away if you are not doing well or get worse. Document Released: 02/04/2005 Document Revised: 02/09/2013 Document Reviewed: 07/10/2012 [employer] Patient Information 2015 [employer]. This information is not intended to replace advice given to you by your health care provider. Make sure you discuss any questions you have with your health care provider.  

## 2014-05-12 NOTE — Consult Note (Addendum)
Consulting cardiologist: Dr Carlyle Dolly MD  Clinical Summary Mr. Cadieux is a 44 y.o.male no known cardiac history but multiple CAD risk factors including tobacco, HTN admitted with dizziness. Reports episode occurred this AM around 615 while standing and drinking coffee at work. Felt dizzy and diaphoretic. Went to bathroom and had episode of diarrhea, and then had some dizziness that came and went throughout the rest of the morning. No chest pain, no palpitations, no SOB. No syncope. Reports 2 similar episodes in December that also occurred early in AM while drinking coffee in AM. He went to urgent care this AM, EKG showed SR with TWI V3-V6, and he was sent to ER. EKG on repeat in ER shows resolution of changes. Denies any other recent cardiac symptoms.     Trop neg x1, K 3.8, Cr 1.12, Hgb 14.4, Plt 253 CXR no acute process ER EKG non-specific ST/T changes      No Known Allergies      Past Medical History  Diagnosis Date  . Hypertension     History reviewed. No pertinent past surgical history.  No family history on file.  Social History Mr. Elzey reports that he has been smoking.  He does not have any smokeless tobacco history on file. Mr. Catena reports that he drinks alcohol.  Review of Systems CONSTITUTIONAL: No weight loss, fever, chills, weakness or fatigue.  HEENT: Eyes: No visual loss, blurred vision, double vision or yellow sclerae. No hearing loss, sneezing, congestion, runny nose or sore throat.  SKIN: No rash or itching.  CARDIOVASCULAR: No chest pain, chest pressure or chest discomfort. No palpitations or edema.  RESPIRATORY: No shortness of breath, cough or sputum.  GASTROINTESTINAL: No anorexia, nausea, vomiting or diarrhea. No abdominal pain or blood.  GENITOURINARY: no polyuria, no dysuria NEUROLOGICAL: per HPI.  MUSCULOSKELETAL: No muscle, back pain, joint pain or stiffness.  HEMATOLOGIC: No anemia, bleeding or bruising.  LYMPHATICS: No enlarged  nodes. No history of splenectomy.  PSYCHIATRIC: No history of depression or anxiety.      Physical Examination Blood pressure 156/93, pulse 58, temperature 98.2 F (36.8 C), temperature source Oral, resp. rate 21, height 6' (1.829 m), weight 235 lb (106.595 kg), SpO2 97 %. No intake or output data in the 24 hours ending 05/12/14 1857  HEENT: sclera clear  Cardiovascular: RRR, no m/r/g, no JVD  Respiratory: CTAB  GI: abdomen soft, NT, ND  MSK: no LE edema  Neuro: no focal deficits  Psych: appropriate affect   Lab Results  Basic Metabolic Panel:  Recent Labs Lab 05/12/14 1709  NA 136  K 3.8  CL 98  CO2 30  GLUCOSE 101*  BUN 10  CREATININE 1.12  CALCIUM 9.2    Liver Function Tests: No results for input(s): AST, ALT, ALKPHOS, BILITOT, PROT, ALBUMIN in the last 168 hours.  CBC:  Recent Labs Lab 05/12/14 1709  WBC 7.1  NEUTROABS 3.1  HGB 14.4  HCT 43.0  MCV 86.3  PLT 253    Cardiac Enzymes:  Recent Labs Lab 05/12/14 1709  TROPONINI <0.03    BNP: Invalid input(s): POCBNP     Impression/Recommendations  1. Dizziness - unclear etiology. No other cardiac symptoms at the time. EKG from urgent care with TWI V3-V6 that have since resolved, no current ischemic changes. Troponin is negative. Orthostatics negative - patient is not in favor of overnight observation, has a 35 year old son at home with no one to take care of him - no current  symptoms, unclear etiology of symptoms with non-specific EKG changes. Troponin negative nearly 12 hours out from episode.  - per patient request he will be discharged from ER, we will contact our scheduling office to set up outpatient cardiology follow up. Please start ASA 81mg  daily, continue other current meds    Carlyle Dolly, M.D.

## 2014-05-12 NOTE — ED Provider Notes (Signed)
CSN: [id number]     Arrival date & time 05/12/14  1619 History   First MD Initiated Contact with Patient 05/12/14 1632     Chief Complaint  Patient presents with  . Dizziness     (Consider location/radiation/quality/duration/timing/severity/associated sxs/prior Treatment) HPI  44 year old male presents as a transfer from urgent care after an abnormal EKG. The patient went to urgent care because he had dizziness this morning. Around 6:30 AM he felt lightheaded while standing at work talking to coworkers. He states that for about 5 minutes folic is going to pass out. He did not actually pass out. He never had chest pain, shortness of breath, headache, or focal weakness. He then fell again to go the bathroom and had one loose bowel movement. Denies abdominal pain. Symptoms completely resolved. He felt intermittently lightheaded once or twice more through the day but denies any other symptoms. He has a past medical history of hypertension, mild hyperlipidemia, and is trying to quit smoking. No prior coronary disease to his knowledge. Denies exertional chest pain or dyspnea.  Past Medical History  Diagnosis Date  . Hypertension    History reviewed. No pertinent past surgical history. No family history on file. History  Substance Use Topics  . Smoking status: Current Every Day Smoker -- 0.25 packs/day  . Smokeless tobacco: Not on file  . Alcohol Use: Yes    Review of Systems  Constitutional: Negative for fever.  Respiratory: Negative for shortness of breath.   Cardiovascular: Negative for chest pain.  Gastrointestinal: Positive for diarrhea. Negative for nausea, vomiting and abdominal pain.  Neurological: Positive for light-headedness. Negative for syncope, weakness and headaches.  All other systems reviewed and are negative.     Allergies  Review of patient's allergies indicates no known allergies.  Home Medications   Prior to Admission medications   Medication Sig Start Date  End Date Taking? Authorizing Provider  albuterol (PROVENTIL HFA;VENTOLIN HFA) 108 (90 BASE) MCG/ACT inhaler Inhale 1-2 puffs into the lungs every 4 (four) hours as needed for wheezing or shortness of breath. 11/01/13   Linton Flemings, MD  benzonatate (TESSALON) 200 MG capsule Take 1 capsule (200 mg total) by mouth 3 (three) times daily as needed for cough. 11/01/13   Linton Flemings, MD  chlorpheniramine-HYDROcodone [facility] PENNKINETIC ER) 10-8 MG/5ML LQCR Take 5 mLs by mouth every 12 (twelve) hours as needed for cough. 11/01/13   Linton Flemings, MD  hydrochlorothiazide (HYDRODIURIL) 25 MG tablet Take 0.5 tablets (12.5 mg total) by mouth daily. 01/25/13   Pamella Pert, MD  valACYclovir (VALTREX) 500 MG tablet Take 500 mg by mouth daily as needed.    Historical Provider, MD  Varenicline Tartrate (CHANTIX PO) Take by mouth.    Historical Provider, MD   BP 158/87 mmHg  Pulse 59  Temp(Src) 98.2 F (36.8 C) (Oral)  Resp 20  Ht 6' (1.829 m)  Wt 235 lb (106.595 kg)  BMI 31.86 kg/m2  SpO2 95% Physical Exam  Constitutional: He is oriented to person, place, and time. He appears well-developed and well-nourished.  HENT:  Head: Normocephalic and atraumatic.  Right Ear: External ear normal.  Left Ear: External ear normal.  Nose: Nose normal.  Eyes: EOM are normal. Pupils are equal, round, and reactive to light. Right eye exhibits no discharge. Left eye exhibits no discharge.  Neck: Neck supple.  Cardiovascular: Normal rate, regular rhythm, normal heart sounds and intact distal pulses.   Pulmonary/Chest: Effort normal and breath sounds normal.  Abdominal: Soft. He exhibits no  distension. There is no tenderness.  Musculoskeletal: He exhibits no edema.  Neurological: He is alert and oriented to person, place, and time.  CN II-12 grossly intact. 5/5 strength in all 4 extremities. Normal finger to nose.  Skin: Skin is warm and dry.  Nursing note and vitals reviewed.   ED Course  Procedures (including  critical care time) Labs Review Labs Reviewed  BASIC METABOLIC PANEL - Abnormal; Notable for the following:    Glucose, Bld 101 (*)    GFR calc non Af Amer 80 (*)    All other components within normal limits  CBC WITH DIFFERENTIAL/PLATELET - Abnormal; Notable for the following:    Eosinophils Relative 6 (*)    All other components within normal limits  TROPONIN I    Imaging Review Dg Chest 2 View  05/12/2014   CLINICAL DATA:  Weakness and syncope.  EXAM: CHEST  2 VIEW  COMPARISON:  11/01/2013  FINDINGS: No cardiomegaly when accounting for low lung volumes. Negative aortic and hilar contours.  There is no edema, consolidation, effusion, or pneumothorax.  IMPRESSION: No active cardiopulmonary disease.   Electronically Signed   By: Monte Fantasia M.D.   On: 05/12/2014 17:10     EKG Interpretation   Date/Time:  Thursday May 12 2014 16:25:18 EDT Ventricular Rate:  53 PR Interval:  145 QRS Duration: 90 QT Interval:  446 QTC Calculation: 419 R Axis:   84 Text Interpretation:  Sinus rhythm Borderline repolarization abnormality  Borderline ST elevation, lateral leads T wave inversions from earlier in  the day resolved Confirmed by Hooker ([id number]) on 05/12/2014  4:56:20 PM      MDM   Final diagnoses:  Near syncope  Abnormal EKG    Patient with atypical near-syncope story without chest pain or anginal equivalent. However EKG that was reviewed from urgent care shows T-wave inversions in V3-6. This is new for him. These T-wave inversions have resolved on the EKG in this ER. He never had chest pain. Troponin drawn 11 hours after onset and resolution of symptoms is negative. Do not feel second is warranted. I discussed this case with Dr. Harl Bowie of cardiology who recommends hospitalist admission. He has seen the patient in consultation. When I discussed this with the patient he does not want to stay, citing his 47 year old son is at home alone. I discussed risks of leaving  without further workup in the hospital including heart attack and death. Patient understands these risks and has clear reasoning for wanting to leave. He is stable for discharge, will DC with 81 mg aspirin and he will get close outpatient cardiology follow-up.    Sherwood Gambler, MD 05/13/14 [phone]

## 2014-05-12 NOTE — ED Provider Notes (Signed)
CSN: [id number]     Arrival date & time 05/12/14  1153 History   First MD Initiated Contact with Patient 05/12/14 1344     Chief Complaint  Patient presents with  . Abdominal Pain   (Consider location/radiation/quality/duration/timing/severity/associated sxs/prior Treatment) HPI Comments: 44 y/o AA Harvey with hx of HTN, hyperlipidemia and tobacco use presents following episode at 630am this morning at which time he was standing outside his office drinking a cup of coffee and developed sudden onset of dizziness, diaphoresis followed by an episode of diarrhea. He has continued to have episodes of dizziness, diaphoresis and nausea throughout the day without further diarrhea. States one of the episodes was also associated with palpitations. Denies chest pain or dyspnea. Reports he had a similar episode in Dec. 2015 for which he saw his PCP. It was advised by his PCP that he simply observe himself for additional symptoms at home. Endorses increased fatigue and decreased exercise tolerance since Dec. 2015. States he often becomes quite winded when taking the stairs at work.  FHx: brother dx'ed with cardiac disease at age 52. Father with CAD.CVD/DM and has AICD. Mother and sister with HTN Patient works in [employer], production PCP: Dr. Donnie Coffin  The history is provided by the patient.    Past Medical History  Diagnosis Date  . Hypertension    History reviewed. No pertinent past surgical history. History reviewed. No pertinent family history. History  Substance Use Topics  . Smoking status: Current Every Day Smoker  . Smokeless tobacco: Not on file  . Alcohol Use: Yes    Review of Systems  All other systems reviewed and are negative.   Allergies  Review of patient's allergies indicates no known allergies.  Home Medications   Prior to Admission medications   Medication Sig Start Date End Date Taking? Authorizing Provider  Varenicline Tartrate (CHANTIX PO) Take by mouth.   Yes  Historical Provider, MD  albuterol (PROVENTIL HFA;VENTOLIN HFA) 108 (90 BASE) MCG/ACT inhaler Inhale 1-2 puffs into the lungs every 4 (four) hours as needed for wheezing or shortness of breath. 11/01/13   Linton Flemings, MD  benzonatate (TESSALON) 200 MG capsule Take 1 capsule (200 mg total) by mouth 3 (three) times daily as needed for cough. 11/01/13   Linton Flemings, MD  chlorpheniramine-HYDROcodone [facility] PENNKINETIC ER) 10-8 MG/5ML LQCR Take 5 mLs by mouth every 12 (twelve) hours as needed for cough. 11/01/13   Linton Flemings, MD  hydrochlorothiazide (HYDRODIURIL) 25 MG tablet Take 0.5 tablets (12.5 mg total) by mouth daily. 01/25/13   Pamella Pert, MD  valACYclovir (VALTREX) 500 MG tablet Take 500 mg by mouth daily as needed.    Historical Provider, MD   BP 135/85 mmHg  Pulse 71  Temp(Src) 99 F (37.2 C) (Oral)  Resp 16  SpO2 98% Physical Exam  Constitutional: He is oriented to person, place, and time. He appears well-developed and well-nourished. No distress.  HENT:  Head: Normocephalic and atraumatic.  Eyes: Conjunctivae are normal. No scleral icterus.  Neck: Normal range of motion. Neck supple. No JVD present.  Cardiovascular: Normal rate, regular rhythm and normal heart sounds.   Pulmonary/Chest: Effort normal and breath sounds normal. No respiratory distress. He has no wheezes. He has no rales.  Abdominal: Soft. Bowel sounds are normal. He exhibits no distension. There is no tenderness.  Musculoskeletal: Normal range of motion.  Neurological: He is alert and oriented to person, place, and time.  Skin: Skin is warm and dry. No pallor.  Psychiatric: He  has a normal mood and affect. His behavior is normal.  Nursing note and vitals reviewed.   ED Course  Procedures (including critical care time) Labs Review Labs Reviewed  POCT URINALYSIS DIP (DEVICE)    Imaging Review No results found.   MDM   1. Dizziness   2. Nausea    ED ECG REPORT   Date: 05/12/2014  EKG Time: 1:59  PM  Rate: 65 bpm  Rhythm: normal sinus rhythm,    Axis: normal  Intervals:normal  ST&T Change: T wave inversion in  leads III, F, V3-V6  Narrative Interpretation: tracing compared to that of Sept. 2012 and T wave inversions described above were NOT present on 2012 tracing. Current T wave inversions are concerning for ischemia.  Patient placed on cardiac monitor, 02, given aspirin and IV NS initiated. 44 y/o AA Harvey with hx of HTN, hyperlipidemia now with new inferior and lateral T wave inversions and episodes of dizziness and nausea today. Will transfer via EMS to [facility] ER for further evaluation.             Lutricia Feil, [place] 05/12/14 [phone]

## 2014-05-12 NOTE — ED Notes (Signed)
Placed  On  Cardiac monitor  Nasal  o2  At  2  l  /  Min     Iv  Ns  tko  22  Angio  l  hand 1  Att

## 2014-05-17 ENCOUNTER — Ambulatory Visit: Payer: 59 | Admitting: Cardiology

## 2014-06-14 ENCOUNTER — Ambulatory Visit: Payer: 59 | Admitting: Cardiology

## 2016-03-12 DIAGNOSIS — I10 Essential (primary) hypertension: Secondary | ICD-10-CM | POA: Diagnosis not present

## 2016-03-12 DIAGNOSIS — R739 Hyperglycemia, unspecified: Secondary | ICD-10-CM | POA: Diagnosis not present

## 2016-03-12 DIAGNOSIS — E782 Mixed hyperlipidemia: Secondary | ICD-10-CM | POA: Diagnosis not present

## 2016-05-09 ENCOUNTER — Encounter (HOSPITAL_COMMUNITY): Payer: Self-pay | Admitting: Emergency Medicine

## 2016-05-09 ENCOUNTER — Ambulatory Visit (HOSPITAL_COMMUNITY)
Admission: EM | Admit: 2016-05-09 | Discharge: 2016-05-09 | Disposition: A | Discharge status: Home / Self Care | Payer: Commercial Managed Care - HMO | Attending: Family Medicine | Admitting: Family Medicine

## 2016-05-09 DIAGNOSIS — J40 Bronchitis, not specified as acute or chronic: Secondary | ICD-10-CM

## 2016-05-09 DIAGNOSIS — R059 Cough, unspecified: Secondary | ICD-10-CM

## 2016-05-09 DIAGNOSIS — R05 Cough: Secondary | ICD-10-CM

## 2016-05-09 MED ORDER — BENZONATATE 100 MG PO CAPS: 200.0000 mg | ORAL_CAPSULE | Freq: Three times a day (TID) | ORAL | 0 refills | Status: DC | PRN

## 2016-05-09 MED ORDER — METHYLPREDNISOLONE 4 MG PO TBPK
ORAL_TABLET | ORAL | 0 refills | Status: DC
Start: 2016-05-09 — End: 2017-06-01

## 2016-05-09 NOTE — ED Triage Notes (Signed)
PT reports cough and sore throat for 2 days. History of bronchitis.

## 2016-05-10 NOTE — ED Provider Notes (Signed)
CSN: [id number]     Arrival date & time 05/09/16  1002 History   First MD Initiated Contact with Patient 05/09/16 1021     Chief Complaint  Patient presents with  . Cough  . Sore Throat   (Consider location/radiation/quality/duration/timing/severity/associated sxs/prior Treatment) Patient c/o cough for 2 days   The history is provided by the patient.  Cough  Cough characteristics:  Productive Sputum characteristics:  White Severity:  Moderate Onset quality:  Sudden Duration:  2 days Timing:  Constant Progression:  Worsening Chronicity:  New Smoker: no   Relieved by:  Nothing Worsened by:  Nothing Ineffective treatments:  None tried Sore Throat     Past Medical History:  Diagnosis Date  . Hypertension    Past Surgical History:  Procedure Laterality Date  . VASECTOMY     No family history on file. Social History  Substance Use Topics  . Smoking status: Current Every Day Smoker    Packs/day: 0.50  . Smokeless tobacco: Never Used  . Alcohol use Yes     Comment: occasional     Review of Systems  Constitutional: Negative.   HENT: Negative.   Eyes: Negative.   Respiratory: Positive for cough.   Cardiovascular: Negative.   Gastrointestinal: Negative.   Endocrine: Negative.   Genitourinary: Negative.   Musculoskeletal: Negative.   Allergic/Immunologic: Negative.   Neurological: Negative.   Hematological: Negative.   Psychiatric/Behavioral: Negative.     Allergies  Patient has no known allergies.  Home Medications   Prior to Admission medications   Medication Sig Start Date End Date Taking? Authorizing Provider  amLODipine (NORVASC) 5 MG tablet Take 5 mg by mouth daily.   Yes Historical Provider, MD  aspirin 81 MG chewable tablet Chew 1 tablet (81 mg total) by mouth daily. 05/12/14  Yes Sherwood Gambler, MD  lisinopril (PRINIVIL,ZESTRIL) 10 MG tablet Take 10 mg by mouth daily.   Yes Historical Provider, MD  valACYclovir (VALTREX) 500 MG tablet Take 500 mg  by mouth daily as needed. For outbreaks   Yes Historical Provider, MD  albuterol (PROVENTIL HFA;VENTOLIN HFA) 108 (90 BASE) MCG/ACT inhaler Inhale 1-2 puffs into the lungs every 4 (four) hours as needed for wheezing or shortness of breath. 11/01/13   Linton Flemings, MD  benzonatate (TESSALON) 100 MG capsule Take 2 capsules (200 mg total) by mouth 3 (three) times daily as needed for cough. 05/09/16   Lysbeth Penner, FNP  benzonatate (TESSALON) 200 MG capsule Take 1 capsule (200 mg total) by mouth 3 (three) times daily as needed for cough. Patient not taking: Reported on 05/12/2014 11/01/13   Linton Flemings, MD  chlorpheniramine-HYDROcodone [facility] ER) 10-8 MG/5ML LQCR Take 5 mLs by mouth every 12 (twelve) hours as needed for cough. Patient not taking: Reported on 05/12/2014 11/01/13   Linton Flemings, MD  hydrochlorothiazide (HYDRODIURIL) 25 MG tablet Take 0.5 tablets (12.5 mg total) by mouth daily. Patient not taking: Reported on 05/12/2014 01/25/13   Pamella Pert, MD  methylPREDNISolone (MEDROL DOSEPAK) 4 MG TBPK tablet Take 6-5-4-3-2-1 po qd 05/09/16   Lysbeth Penner, FNP  Varenicline Tartrate (CHANTIX PO) Take 1 tablet by mouth daily.     Historical Provider, MD   Meds Ordered and Administered this Visit  Medications - No data to display  BP 129/84 (BP Location: Left Arm)   Pulse 76   Temp 99 F (37.2 C) (Oral)   Resp 16   Ht 5\' 11"  (1.803 m)   Wt 230 lb (104.3 kg)  SpO2 99%   BMI 32.08 kg/m  No data found.   Physical Exam  Constitutional: He appears well-developed and well-nourished.  HENT:  Head: Normocephalic and atraumatic.  Right Ear: External ear normal.  Left Ear: External ear normal.  Mouth/Throat: Oropharynx is clear and moist.  Eyes: Conjunctivae are normal. Pupils are equal, round, and reactive to light.  Neck: Normal range of motion. Neck supple.  Cardiovascular: Normal rate, regular rhythm and normal heart sounds.   Pulmonary/Chest: Effort normal and breath  sounds normal.  Abdominal: Soft. Bowel sounds are normal.  Musculoskeletal: Normal range of motion.  Nursing note and vitals reviewed.   Urgent Care Course     Procedures (including critical care time)  Labs Review Labs Reviewed - No data to display  Imaging Review No results found.   Visual Acuity Review  Right Eye Distance:   Left Eye Distance:   Bilateral Distance:    Right Eye Near:   Left Eye Near:    Bilateral Near:         MDM   1. Bronchitis   2. Cough    Tessalon Perles Medrol dose pack as directed  Push po fluids, rest, tylenol and motrin otc prn as directed for fever, arthralgias, and myalgias.  Follow up prn if sx's continue or persist.    Lysbeth Penner, FNP 05/10/16 1121

## 2016-09-11 DIAGNOSIS — E782 Mixed hyperlipidemia: Secondary | ICD-10-CM | POA: Diagnosis not present

## 2016-09-11 DIAGNOSIS — I10 Essential (primary) hypertension: Secondary | ICD-10-CM | POA: Diagnosis not present

## 2016-09-11 DIAGNOSIS — R739 Hyperglycemia, unspecified: Secondary | ICD-10-CM | POA: Diagnosis not present

## 2017-04-21 DIAGNOSIS — I10 Essential (primary) hypertension: Secondary | ICD-10-CM | POA: Diagnosis not present

## 2017-04-21 DIAGNOSIS — E119 Type 2 diabetes mellitus without complications: Secondary | ICD-10-CM | POA: Diagnosis not present

## 2017-04-21 DIAGNOSIS — E782 Mixed hyperlipidemia: Secondary | ICD-10-CM | POA: Diagnosis not present

## 2017-06-01 ENCOUNTER — Encounter (HOSPITAL_COMMUNITY): Payer: Self-pay | Admitting: *Deleted

## 2017-06-01 ENCOUNTER — Emergency Department (HOSPITAL_COMMUNITY)
Admission: EM | Admit: 2017-06-01 | Discharge: 2017-06-01 | Disposition: A | Discharge status: Home / Self Care | Payer: 59 | Attending: Emergency Medicine | Admitting: Emergency Medicine

## 2017-06-01 ENCOUNTER — Other Ambulatory Visit: Payer: Self-pay

## 2017-06-01 ENCOUNTER — Emergency Department (HOSPITAL_COMMUNITY): Payer: 59

## 2017-06-01 DIAGNOSIS — Z79899 Other long term (current) drug therapy: Secondary | ICD-10-CM | POA: Insufficient documentation

## 2017-06-01 DIAGNOSIS — R0789 Other chest pain: Secondary | ICD-10-CM | POA: Insufficient documentation

## 2017-06-01 DIAGNOSIS — K219 Gastro-esophageal reflux disease without esophagitis: Secondary | ICD-10-CM | POA: Diagnosis not present

## 2017-06-01 DIAGNOSIS — F17210 Nicotine dependence, cigarettes, uncomplicated: Secondary | ICD-10-CM | POA: Insufficient documentation

## 2017-06-01 DIAGNOSIS — R079 Chest pain, unspecified: Secondary | ICD-10-CM | POA: Diagnosis not present

## 2017-06-01 HISTORY — DX: Cluster headache syndrome, unspecified, not intractable: G44.009

## 2017-06-01 HISTORY — DX: Herpesviral infection of urogenital system, unspecified: A60.00

## 2017-06-01 HISTORY — DX: Gastro-esophageal reflux disease without esophagitis: K21.9

## 2017-06-01 LAB — CBC
HCT: 40.6 % (ref 39.0–52.0)
HEMOGLOBIN: 13.4 g/dL (ref 13.0–17.0)
MCH: 29.3 pg (ref 26.0–34.0)
MCHC: 33 g/dL (ref 30.0–36.0)
MCV: 88.6 fL (ref 78.0–100.0)
Platelets: 300 10*3/uL (ref 150–400)
RBC: 4.58 MIL/uL (ref 4.22–5.81)
RDW: 13.7 % (ref 11.5–15.5)
WBC: 9 10*3/uL (ref 4.0–10.5)

## 2017-06-01 LAB — BASIC METABOLIC PANEL
ANION GAP: 13 (ref 5–15)
BUN: 13 mg/dL (ref 6–20)
CHLORIDE: 100 mmol/L — AB (ref 101–111)
CO2: 24 mmol/L (ref 22–32)
Calcium: 9.3 mg/dL (ref 8.9–10.3)
Creatinine, Ser: 1.11 mg/dL (ref 0.61–1.24)
GFR calc Af Amer: >60 mL/min (ref >60)
GFR calc non Af Amer: >60 mL/min (ref >60)
GLUCOSE: 165 mg/dL — AB (ref 65–99)
Potassium: 3.7 mmol/L (ref 3.5–5.1)
Sodium: 137 mmol/L (ref 135–145)

## 2017-06-01 LAB — I-STAT TROPONIN, ED: Troponin i, poc: 0 ng/mL (ref 0.00–0.08)

## 2017-06-01 MED ORDER — GI COCKTAIL ~~LOC~~
30.0000 mL | Freq: Once | ORAL | Status: AC
  Administered 2017-06-01: 30 mL via ORAL
  Filled 2017-06-01: qty 30

## 2017-06-01 MED ORDER — OMEPRAZOLE 20 MG PO CPDR: 20.0000 mg | DELAYED_RELEASE_CAPSULE | Freq: Every day | ORAL | 0 refills | Status: DC

## 2017-06-01 NOTE — ED Provider Notes (Signed)
[facility] DEPT Provider Note   CSN: [id number] Arrival date & time: 06/01/17  0130     History   Chief Complaint Chief Complaint  Patient presents with  . Chest Pain    HPI Michael Harvey is a 47 y.o. male.  HPI  This 47 year old male with a history of hypertension and reflux who presents with chest pain.  Patient reports chest pain ongoing for the last 2-3 days.  Some improvement with his ranitidine.  However, last night when he laid down it got worse.  Described as pressure.  No worsening specifically with food.  Current pain is 3 out of 10.  Denies any fevers, cough, shortness of breath.  History of hypertension.  No history of diabetes, hyperlipidemia, smoking, early family history of heart disease.  Denies any leg swelling or recent travel, history of blood clots.  Past Medical History:  Diagnosis Date  . Acid reflux   . Cluster headaches   . Genital herpes   . Hypertension     Patient Active Problem List   Diagnosis Date Noted  . Dizziness 05/12/2014  . Abnormal EKG 05/12/2014  . Atypical chest pain 01/25/2013    Past Surgical History:  Procedure Laterality Date  . VASECTOMY          Home Medications    Prior to Admission medications   Medication Sig Start Date End Date Taking? Authorizing Provider  CIALIS 20 MG tablet Take 20 mg by mouth daily as needed for erectile dysfunction.  05/26/17  Yes [provider]  lisinopril-hydrochlorothiazide (PRINZIDE,ZESTORETIC) 20-25 MG tablet Take 1 tablet by mouth daily. 05/05/17  Yes [provider]  ranitidine (ZANTAC) 75 MG tablet Take 150 mg by mouth at bedtime as needed for heartburn.   Yes [provider]  valACYclovir (VALTREX) 1000 MG tablet Take 1 g by mouth daily. 05/04/17  Yes [provider]  albuterol (PROVENTIL HFA;VENTOLIN HFA) 108 (90 BASE) MCG/ACT inhaler Inhale 1-2 puffs into the lungs every 4 (four) hours as needed for wheezing or  shortness of breath. Patient not taking: Reported on 06/01/2017 11/01/13   Linton Flemings, MD  aspirin 81 MG chewable tablet Chew 1 tablet (81 mg total) by mouth daily. Patient not taking: Reported on 06/01/2017 05/12/14   Sherwood Gambler, MD  benzonatate (TESSALON) 200 MG capsule Take 1 capsule (200 mg total) by mouth 3 (three) times daily as needed for cough. Patient not taking: Reported on 05/12/2014 11/01/13   Linton Flemings, MD  chlorpheniramine-HYDROcodone [facility] PENNKINETIC ER) 10-8 MG/5ML [facility] Take 5 mLs by mouth every 12 (twelve) hours as needed for cough. Patient not taking: Reported on 05/12/2014 11/01/13   Linton Flemings, MD  hydrochlorothiazide (HYDRODIURIL) 25 MG tablet Take 0.5 tablets (12.5 mg total) by mouth daily. Patient not taking: Reported on 05/12/2014 01/25/13   Pamella Pert, MD  omeprazole (PRILOSEC) 20 MG capsule Take 1 capsule (20 mg total) by mouth daily. 06/01/17   Ekaterina Denise, Barbette Hair, MD    Family History No family history on file.  Social History Social History   Tobacco Use  . Smoking status: Current Every Day Smoker    Packs/day: 0.50  . Smokeless tobacco: Never Used  Substance Use Topics  . Alcohol use: Yes    Comment: occasional   . Drug use: No     Allergies   Patient has no known allergies.   Review of Systems Review of Systems  Constitutional: Negative for fever.  Respiratory: Negative for shortness of  breath.   Cardiovascular: Positive for chest pain. Negative for leg swelling.  Gastrointestinal: Negative for abdominal pain, nausea and vomiting.  All other systems reviewed and are negative.    Physical Exam Updated Vital Signs BP (!) 159/99 (BP Location: Right Arm)   Pulse 74   Temp 98.8 F (37.1 C) (Oral)   Resp 15   Ht 5\' 10"  (1.778 m)   Wt 108.9 kg (240 lb)   SpO2 100%   BMI 34.44 kg/m   Physical Exam  Constitutional: He is oriented to person, place, and time. He appears well-developed and well-nourished.  Overweight, no acute  distress  HENT:  Head: Normocephalic and atraumatic.  Cardiovascular: Normal rate, regular rhythm and normal heart sounds.  No murmur heard. Pulmonary/Chest: Effort normal and breath sounds normal. No respiratory distress. He has no wheezes.  Abdominal: Soft. Bowel sounds are normal. There is no tenderness. There is no rebound.  Musculoskeletal: He exhibits no edema.  Lymphadenopathy:    He has no cervical adenopathy.  Neurological: He is alert and oriented to person, place, and time.  Skin: Skin is warm and dry.  Psychiatric: He has a normal mood and affect.  Nursing note and vitals reviewed.    ED Treatments / Results  Labs (all labs ordered are listed, but only abnormal results are displayed) Labs Reviewed  BASIC METABOLIC PANEL - Abnormal; Notable for the following components:      Result Value   Chloride 100 (*)    Glucose, Bld 165 (*)    All other components within normal limits  CBC  I-STAT TROPONIN, ED    EKG EKG Interpretation  Date/Time:  Sunday June 01 2017 01:56:16 EDT Ventricular Rate:  77 PR Interval:    QRS Duration: 91 QT Interval:  388 QTC Calculation: 440 R Axis:   85 Text Interpretation:  Sinus rhythm Confirmed by Thayer Jew [phone]) on 06/01/2017 4:19:24 AM   Radiology Dg Chest 2 View  Result Date: 06/01/2017 CLINICAL DATA:  Chest pain. EXAM: CHEST - 2 VIEW COMPARISON:  05/12/2014 FINDINGS: The cardiomediastinal contours are normal. The lungs are clear. Pulmonary vasculature is normal. No consolidation, pleural effusion, or pneumothorax. No acute osseous abnormalities are seen. IMPRESSION: Unremarkable radiographs of the chest. Electronically Signed   By: Jeb Levering M.D.   On: 06/01/2017 02:29    Procedures Procedures (including critical care time)  Medications Ordered in ED Medications  gi cocktail (Maalox,Lidocaine,Donnatal) (30 mLs Oral Given 06/01/17 0517)     Initial Impression / Assessment and Plan / ED Course  I have  reviewed the triage vital signs and the nursing notes.  Pertinent labs & imaging results that were available during my care of the patient were reviewed by me and considered in my medical decision making (see chart for details).     Presents with chest pain.  Fairly atypical for ACS.  Low risk.  EKG shows no evidence of ischemia.  Chest x-ray shows no evidence of pneumothorax or pneumonia.  Lab work including troponin is negative.  Doubt ACS.  Doubt PE.  Symptoms most consistent with reflux.  Patient was given GI cocktail.  On recheck, symptoms have resolved.  Will transition from ranitidine to omeprazole.  Follow-up with primary physician recommended.  After history, exam, and medical workup I feel the patient has been appropriately medically screened and is safe for discharge home. Pertinent diagnoses were discussed with the patient. Patient was given return precautions.   Final Clinical Impressions(s) / ED Diagnoses  Final diagnoses:  Atypical chest pain  Gastroesophageal reflux disease, esophagitis presence not specified    ED Discharge Orders        Ordered    omeprazole (PRILOSEC) 20 MG capsule  Daily     06/01/17 0617       Merryl Hacker, MD 06/01/17 [phone]

## 2017-06-01 NOTE — ED Notes (Signed)
Bed: WA03 Expected date:  Expected time:  Means of arrival:  Comments: 

## 2017-06-01 NOTE — Discharge Instructions (Addendum)
You were seen today for chest pain.  This is likely related to reflux.  Start omeprazole daily.

## 2017-06-01 NOTE — ED Triage Notes (Signed)
Pt stated "I feel a lot of chest pressure.  I've taken 2 ranitidine.  Saw my doctor for same about a month ago."

## 2017-08-19 DIAGNOSIS — K219 Gastro-esophageal reflux disease without esophagitis: Secondary | ICD-10-CM | POA: Diagnosis not present

## 2017-08-19 DIAGNOSIS — R112 Nausea with vomiting, unspecified: Secondary | ICD-10-CM | POA: Diagnosis not present

## 2017-10-27 DIAGNOSIS — E782 Mixed hyperlipidemia: Secondary | ICD-10-CM | POA: Diagnosis not present

## 2017-10-27 DIAGNOSIS — I10 Essential (primary) hypertension: Secondary | ICD-10-CM | POA: Diagnosis not present

## 2017-10-27 DIAGNOSIS — E119 Type 2 diabetes mellitus without complications: Secondary | ICD-10-CM | POA: Diagnosis not present

## 2018-02-11 ENCOUNTER — Encounter (HOSPITAL_COMMUNITY): Payer: Self-pay | Admitting: Emergency Medicine

## 2018-02-11 ENCOUNTER — Other Ambulatory Visit: Payer: Self-pay

## 2018-02-11 ENCOUNTER — Emergency Department (HOSPITAL_COMMUNITY): Payer: 59

## 2018-02-11 ENCOUNTER — Emergency Department (HOSPITAL_COMMUNITY)
Admission: EM | Admit: 2018-02-11 | Discharge: 2018-02-12 | Disposition: A | Discharge status: Home / Self Care | Payer: 59 | Attending: Emergency Medicine | Admitting: Emergency Medicine

## 2018-02-11 DIAGNOSIS — R0789 Other chest pain: Secondary | ICD-10-CM | POA: Insufficient documentation

## 2018-02-11 DIAGNOSIS — R079 Chest pain, unspecified: Secondary | ICD-10-CM | POA: Diagnosis not present

## 2018-02-11 DIAGNOSIS — F17210 Nicotine dependence, cigarettes, uncomplicated: Secondary | ICD-10-CM | POA: Diagnosis not present

## 2018-02-11 DIAGNOSIS — I10 Essential (primary) hypertension: Secondary | ICD-10-CM | POA: Insufficient documentation

## 2018-02-11 LAB — CBC
HCT: 38.1 % — ABNORMAL LOW (ref 39.0–52.0)
Hemoglobin: 12.3 g/dL — ABNORMAL LOW (ref 13.0–17.0)
MCH: 28.3 pg (ref 26.0–34.0)
MCHC: 32.3 g/dL (ref 30.0–36.0)
MCV: 87.8 fL (ref 80.0–100.0)
NRBC: 0 % (ref 0.0–0.2)
PLATELETS: 258 10*3/uL (ref 150–400)
RBC: 4.34 MIL/uL (ref 4.22–5.81)
RDW: 13.7 % (ref 11.5–15.5)
WBC: 8.5 10*3/uL (ref 4.0–10.5)

## 2018-02-11 LAB — COMPREHENSIVE METABOLIC PANEL
ALT: 13 U/L (ref 0–44)
AST: 12 U/L — ABNORMAL LOW (ref 15–41)
Albumin: 3.6 g/dL (ref 3.5–5.0)
Alkaline Phosphatase: 58 U/L (ref 38–126)
Anion gap: 8 (ref 5–15)
BUN: 10 mg/dL (ref 6–20)
CO2: 27 mmol/L (ref 22–32)
Calcium: 8.8 mg/dL — ABNORMAL LOW (ref 8.9–10.3)
Chloride: 102 mmol/L (ref 98–111)
Creatinine, Ser: 1.02 mg/dL (ref 0.61–1.24)
GFR calc Af Amer: >60 mL/min (ref >60)
GFR calc non Af Amer: >60 mL/min (ref >60)
GLUCOSE: 150 mg/dL — AB (ref 70–99)
Potassium: 3.4 mmol/L — ABNORMAL LOW (ref 3.5–5.1)
Sodium: 137 mmol/L (ref 135–145)
Total Bilirubin: 0.7 mg/dL (ref 0.3–1.2)
Total Protein: 7.2 g/dL (ref 6.5–8.1)

## 2018-02-11 LAB — I-STAT TROPONIN, ED: Troponin i, poc: 0.01 ng/mL (ref 0.00–0.08)

## 2018-02-11 LAB — LIPASE, BLOOD: Lipase: 24 U/L (ref 11–51)

## 2018-02-11 MED ORDER — SUCRALFATE 1 G PO TABS: 1.0000 g | ORAL_TABLET | Freq: Three times a day (TID) | ORAL | 0 refills | Status: AC

## 2018-02-11 MED ORDER — FAMOTIDINE 20 MG PO TABS: 20.0000 mg | ORAL_TABLET | Freq: Two times a day (BID) | ORAL | 0 refills | Status: DC

## 2018-02-11 MED ORDER — FAMOTIDINE IN NACL 20-0.9 MG/50ML-% IV SOLN
20.0000 mg | Freq: Once | INTRAVENOUS | Status: AC
  Administered 2018-02-11: 20 mg via INTRAVENOUS
  Filled 2018-02-11: qty 50

## 2018-02-11 MED ORDER — SODIUM CHLORIDE 0.9 % IV BOLUS
1000.0000 mL | Freq: Once | INTRAVENOUS | Status: AC
  Administered 2018-02-11: 1000 mL via INTRAVENOUS

## 2018-02-11 MED ORDER — SUCRALFATE 1 GM/10ML PO SUSP
1.0000 g | Freq: Once | ORAL | Status: AC
  Administered 2018-02-11: 1 g via ORAL
  Filled 2018-02-11: qty 10

## 2018-02-11 NOTE — ED Triage Notes (Signed)
Pt reports epigastric lower chest pain onset today about  10:00.  Hx GERD

## 2018-02-11 NOTE — Discharge Instructions (Signed)
As discussed, your evaluation today has been largely reassuring.  But, it is important that you monitor your condition carefully, and do not hesitate to return to the ED if you develop new, or concerning changes in your condition.  Your pain is most likely due to irritation of your stomach and esophagus.  Please follow-up with both your primary care physician and the affiliated gastroenterologic center.

## 2018-02-11 NOTE — ED Notes (Signed)
Patient states that he has a history of HTN and has not had his blood pressure medication today.

## 2018-02-11 NOTE — ED Provider Notes (Signed)
[place] DEPT Provider Note   CSN: [id number] Arrival date & time: 02/11/18  2036     History   Chief Complaint Chief Complaint  Patient presents with  . Chest Pain    HPI Michael Harvey is a 47 y.o. male.  HPI Patient presents with concern of chest pain. Pain is in the left para xiphoid region, nonradiating, sore, moderate, worse with food. Pain began yesterday, initially as a seemingly typical episode of GERD. In spite of taking an additional dose of Prilosec about 12 hours ago, the patient has had continued pain in this area, with p.o. intolerance, due to nausea, and discomfort. Patient did vomit twice yesterday, none today. No other chest pain, no other abdominal pain. No fever. Patient has history of non-insulin-dependent diabetes, hypercholesterolemia, and ongoing antibiotic therapy for cutaneous infections. In addition, the patient takes Prilosec daily.  Past Medical History:  Diagnosis Date  . Acid reflux   . Cluster headaches   . Genital herpes   . Hypertension     Patient Active Problem List   Diagnosis Date Noted  . Dizziness 05/12/2014  . Abnormal EKG 05/12/2014  . Atypical chest pain 01/25/2013    Past Surgical History:  Procedure Laterality Date  . VASECTOMY          Home Medications    Prior to Admission medications   Medication Sig Start Date End Date Taking? Authorizing Provider  alum & mag hydroxide-simeth (MYLANTA) 200-200-20 MG/5ML suspension Take 30 mLs by mouth 2 (two) times daily as needed for indigestion or heartburn.   Yes [provider]  atorvastatin (LIPITOR) 20 MG tablet Take 20 mg by mouth daily.   Yes [provider]  lisinopril (PRINIVIL,ZESTRIL) 20 MG tablet Take 20 mg by mouth daily.   Yes [provider]  metFORMIN (GLUCOPHAGE) 500 MG tablet Take 500 mg by mouth daily with breakfast.   Yes [provider]  omeprazole (PRILOSEC) 20 MG capsule Take 1  capsule (20 mg total) by mouth daily. 06/01/17  Yes Horton, Barbette Hair, MD  albuterol (PROVENTIL HFA;VENTOLIN HFA) 108 (90 BASE) MCG/ACT inhaler Inhale 1-2 puffs into the lungs every 4 (four) hours as needed for wheezing or shortness of breath. Patient not taking: Reported on 06/01/2017 11/01/13   Linton Flemings, MD  aspirin 81 MG chewable tablet Chew 1 tablet (81 mg total) by mouth daily. Patient not taking: Reported on 06/01/2017 05/12/14   Sherwood Gambler, MD  benzonatate (TESSALON) 200 MG capsule Take 1 capsule (200 mg total) by mouth 3 (three) times daily as needed for cough. Patient not taking: Reported on 05/12/2014 11/01/13   Linton Flemings, MD  chlorpheniramine-HYDROcodone [facility] PENNKINETIC ER) 10-8 MG/5ML [facility] Take 5 mLs by mouth every 12 (twelve) hours as needed for cough. Patient not taking: Reported on 05/12/2014 11/01/13   Linton Flemings, MD  CIALIS 20 MG tablet Take 20 mg by mouth daily as needed for erectile dysfunction.  05/26/17   [provider]  famotidine (PEPCID) 20 MG tablet Take 1 tablet (20 mg total) by mouth 2 (two) times daily. 02/11/18   Carmin Muskrat, MD  hydrochlorothiazide (HYDRODIURIL) 25 MG tablet Take 0.5 tablets (12.5 mg total) by mouth daily. Patient not taking: Reported on 05/12/2014 01/25/13   Pamella Pert, MD  sucralfate (CARAFATE) 1 g tablet Take 1 tablet (1 g total) by mouth 4 (four) times daily -  with meals and at bedtime. 02/11/18   Carmin Muskrat, MD  valACYclovir (VALTREX) 1000 MG tablet  Take 1 g by mouth daily. 05/04/17   [provider]    Family History History reviewed. No pertinent family history.  Social History Social History   Tobacco Use  . Smoking status: Current Every Day Smoker    Packs/day: 0.50  . Smokeless tobacco: Never Used  Substance Use Topics  . Alcohol use: Yes    Comment: occasional   . Drug use: No     Allergies   Patient has no known allergies.   Review of Systems Review of Systems  Constitutional:        Per HPI, otherwise negative  HENT:       Per HPI, otherwise negative  Respiratory:       Per HPI, otherwise negative  Cardiovascular:       Per HPI, otherwise negative  Gastrointestinal: Positive for abdominal pain, nausea and vomiting.  Endocrine:       Negative aside from HPI  Genitourinary:       Neg aside from HPI   Musculoskeletal:       Per HPI, otherwise negative  Skin: Negative.   Neurological: Negative for syncope.     Physical Exam Updated Vital Signs BP (!) 197/105 (BP Location: Left Arm)   Pulse (!) 53   Temp 98.5 F (36.9 C) (Oral)   Resp 14   Ht 5\' 11"  (1.803 m)   Wt 108.9 kg   SpO2 99%   BMI 33.47 kg/m   Physical Exam Vitals signs and nursing note reviewed.  Constitutional:      General: He is not in acute distress.    Appearance: He is well-developed.  HENT:     Head: Normocephalic and atraumatic.  Eyes:     Conjunctiva/sclera: Conjunctivae normal.  Cardiovascular:     Rate and Rhythm: Normal rate and regular rhythm.  Pulmonary:     Effort: Pulmonary effort is normal. No respiratory distress.     Breath sounds: No stridor.  Abdominal:     General: There is no distension.    Skin:    General: Skin is warm and dry.  Neurological:     Mental Status: He is alert and oriented to person, place, and time.      ED Treatments / Results  Labs (all labs ordered are listed, but only abnormal results are displayed) Labs Reviewed  CBC - Abnormal; Notable for the following components:      Result Value   Hemoglobin 12.3 (*)    HCT 38.1 (*)    All other components within normal limits  COMPREHENSIVE METABOLIC PANEL - Abnormal; Notable for the following components:   Potassium 3.4 (*)    Glucose, Bld 150 (*)    Calcium 8.8 (*)    AST 12 (*)    All other components within normal limits  LIPASE, BLOOD  I-STAT TROPONIN, ED    EKG EKG Interpretation  Date/Time:  Wednesday February 11 2018 20:46:55 EST Ventricular Rate:  65 PR  Interval:    QRS Duration: 88 QT Interval:  410 QTC Calculation: 427 R Axis:   81 Text Interpretation:  Sinus rhythm Baseline wander in lead(s) V3 Artifact Abnormal ekg Confirmed by Carmin Muskrat [phone]) on 02/11/2018 9:21:14 PM   Radiology Dg Chest 2 View  Result Date: 02/11/2018 CLINICAL DATA:  Mid chest pain. EXAM: CHEST - 2 VIEW COMPARISON:  06/01/2017 FINDINGS: Lungs are clear. Cardiomediastinal silhouette and remainder of the exam is unchanged. IMPRESSION: No active cardiopulmonary disease. Electronically Signed   By: Quillian Quince  Derrel Nip M.D.   On: 02/11/2018 21:07    Procedures Procedures (including critical care time)  Medications Ordered in ED Medications  sodium chloride 0.9 % bolus 1,000 mL (1,000 mLs Intravenous New Bag/Given 02/11/18 2238)  famotidine (PEPCID) IVPB 20 mg premix (0 mg Intravenous Stopped 02/11/18 2310)  sucralfate (CARAFATE) 1 GM/10ML suspension 1 g (1 g Oral Given 02/11/18 2241)     Initial Impression / Assessment and Plan / ED Course  I have reviewed the triage vital signs and the nursing notes.  Pertinent labs & imaging results that were available during my care of the patient were reviewed by me and considered in my medical decision making (see chart for details).     11:50 PM On repeat exam the patient is awake, alert, states that he is getting there, when asked how he is feeling. We discussed all findings, importance of following up with primary care and gastroenterology. He does have mild hypertension, but no evidence for endorgan effects. There is suspicion for gastroesophageal etiology, given the description of pain, history of GERD. Patient does have mild hypertension, did not take his blood pressure medicine today. He has no asymmetry of pulses, no complaints of extremity weakness or changes, and is relatively young, low suspicion for dissection, particular given the absence of pain radiating from front to back. Patient understands  importance of following up, was discharged in stable condition.  Final Clinical Impressions(s) / ED Diagnoses   Final diagnoses:  Atypical chest pain    ED Discharge Orders         Ordered    famotidine (PEPCID) 20 MG tablet  2 times daily     02/11/18 2314    sucralfate (CARAFATE) 1 g tablet  3 times daily with meals & bedtime    Note to Pharmacy:  Take for one week   02/11/18 2314           Carmin Muskrat, MD 02/11/18 2351

## 2018-02-16 DIAGNOSIS — K219 Gastro-esophageal reflux disease without esophagitis: Secondary | ICD-10-CM | POA: Diagnosis not present

## 2018-02-25 DIAGNOSIS — D649 Anemia, unspecified: Secondary | ICD-10-CM | POA: Diagnosis not present

## 2018-02-25 DIAGNOSIS — K219 Gastro-esophageal reflux disease without esophagitis: Secondary | ICD-10-CM | POA: Diagnosis not present

## 2018-03-20 DIAGNOSIS — K5900 Constipation, unspecified: Secondary | ICD-10-CM | POA: Diagnosis not present

## 2018-03-26 DIAGNOSIS — Z1211 Encounter for screening for malignant neoplasm of colon: Secondary | ICD-10-CM | POA: Diagnosis not present

## 2018-03-26 DIAGNOSIS — D125 Benign neoplasm of sigmoid colon: Secondary | ICD-10-CM | POA: Diagnosis not present

## 2018-03-26 DIAGNOSIS — D122 Benign neoplasm of ascending colon: Secondary | ICD-10-CM | POA: Diagnosis not present

## 2018-03-26 DIAGNOSIS — D509 Iron deficiency anemia, unspecified: Secondary | ICD-10-CM | POA: Diagnosis not present

## 2018-03-26 DIAGNOSIS — K2930 Chronic superficial gastritis without bleeding: Secondary | ICD-10-CM | POA: Diagnosis not present

## 2018-03-26 DIAGNOSIS — K219 Gastro-esophageal reflux disease without esophagitis: Secondary | ICD-10-CM | POA: Diagnosis not present

## 2018-03-28 ENCOUNTER — Other Ambulatory Visit: Payer: Self-pay | Admitting: Gastroenterology

## 2018-03-28 DIAGNOSIS — K3189 Other diseases of stomach and duodenum: Secondary | ICD-10-CM

## 2018-04-03 ENCOUNTER — Ambulatory Visit
Admission: RE | Admit: 2018-04-03 | Discharge: 2018-04-03 | Disposition: A | Discharge status: Home / Self Care | Payer: 59 | Source: Ambulatory Visit | Attending: Gastroenterology | Admitting: Gastroenterology

## 2018-04-03 DIAGNOSIS — K3189 Other diseases of stomach and duodenum: Secondary | ICD-10-CM

## 2018-04-03 DIAGNOSIS — R109 Unspecified abdominal pain: Secondary | ICD-10-CM | POA: Diagnosis not present

## 2018-04-03 MED ORDER — IOPAMIDOL (ISOVUE-300) INJECTION 61%
100.0000 mL | Freq: Once | INTRAVENOUS | Status: AC | PRN
  Administered 2018-04-03: 100 mL via INTRAVENOUS

## 2018-04-14 ENCOUNTER — Other Ambulatory Visit: Payer: Self-pay | Admitting: Gastroenterology

## 2018-04-14 DIAGNOSIS — K828 Other specified diseases of gallbladder: Secondary | ICD-10-CM

## 2018-04-29 DIAGNOSIS — D649 Anemia, unspecified: Secondary | ICD-10-CM | POA: Diagnosis not present

## 2018-04-29 DIAGNOSIS — K219 Gastro-esophageal reflux disease without esophagitis: Secondary | ICD-10-CM | POA: Diagnosis not present

## 2018-04-29 DIAGNOSIS — K3189 Other diseases of stomach and duodenum: Secondary | ICD-10-CM | POA: Diagnosis not present

## 2018-05-01 ENCOUNTER — Other Ambulatory Visit: Payer: 59

## 2018-05-08 ENCOUNTER — Ambulatory Visit
Admission: RE | Admit: 2018-05-08 | Discharge: 2018-05-08 | Disposition: A | Discharge status: Home / Self Care | Payer: 59 | Source: Ambulatory Visit | Attending: Gastroenterology | Admitting: Gastroenterology

## 2018-05-08 ENCOUNTER — Other Ambulatory Visit: Payer: Self-pay

## 2018-05-08 DIAGNOSIS — K828 Other specified diseases of gallbladder: Secondary | ICD-10-CM

## 2018-05-08 DIAGNOSIS — R109 Unspecified abdominal pain: Secondary | ICD-10-CM | POA: Diagnosis not present

## 2018-05-20 ENCOUNTER — Other Ambulatory Visit: Payer: Self-pay | Admitting: General Surgery

## 2018-05-20 DIAGNOSIS — K429 Umbilical hernia without obstruction or gangrene: Secondary | ICD-10-CM | POA: Diagnosis not present

## 2018-05-20 DIAGNOSIS — K8010 Calculus of gallbladder with chronic cholecystitis without obstruction: Secondary | ICD-10-CM

## 2018-05-20 DIAGNOSIS — K8020 Calculus of gallbladder without cholecystitis without obstruction: Secondary | ICD-10-CM | POA: Diagnosis not present

## 2018-05-22 ENCOUNTER — Other Ambulatory Visit: Payer: Self-pay

## 2018-05-22 ENCOUNTER — Other Ambulatory Visit: Payer: Self-pay | Admitting: General Surgery

## 2018-05-22 ENCOUNTER — Encounter (HOSPITAL_COMMUNITY)
Admission: RE | Admit: 2018-05-22 | Discharge: 2018-05-22 | Disposition: A | Discharge status: Home / Self Care | Payer: 59 | Source: Ambulatory Visit | Attending: General Surgery | Admitting: General Surgery

## 2018-05-22 DIAGNOSIS — K8010 Calculus of gallbladder with chronic cholecystitis without obstruction: Secondary | ICD-10-CM

## 2018-05-22 DIAGNOSIS — K8020 Calculus of gallbladder without cholecystitis without obstruction: Secondary | ICD-10-CM | POA: Diagnosis not present

## 2018-05-22 MED ORDER — TECHNETIUM TC 99M MEBROFENIN IV KIT
5.0000 | PACK | Freq: Once | INTRAVENOUS | Status: AC | PRN
  Administered 2018-05-22: 5 via INTRAVENOUS

## 2018-06-11 ENCOUNTER — Ambulatory Visit: Payer: Self-pay | Admitting: General Surgery

## 2018-06-11 DIAGNOSIS — K429 Umbilical hernia without obstruction or gangrene: Secondary | ICD-10-CM | POA: Diagnosis not present

## 2018-06-11 DIAGNOSIS — K8020 Calculus of gallbladder without cholecystitis without obstruction: Secondary | ICD-10-CM | POA: Diagnosis not present

## 2018-06-11 NOTE — H&P (Signed)
The patient is a 48 year old male who presents for evaluation of gall stones. Referred by: Dr. Alessandra Bevels Chief Complaint: Abdominal pain  Patient is a 48 year old male who comes in with a history of periumbilical, bilateral lower quadrant abdominal pain. Patient states this is usually after several hours after eating. He states that there is no nausea or vomiting with the pain. He states he has not noticed any significant correlation with any high fatty foods, spicy foods.  Patient states that he notices it is a fairly sharp sensation. Patient is undergone CT scan, endoscopy, ultrasound. Endoscopy did reveal small what appears to be polyp within the duodenal gastric junction. Patient also underwent ultrasound which I reviewed personally which revealed a 2.2 cm gallstone at the neck of the gallbladder. Patient had a CT scan which revealed an umbilical hernia. This was small approximate 1-2 cm.    Past Surgical History Nance Pew, CMA; 05/20/2018 10:49 AM) Colon Polyp Removal - Colonoscopy  Vasectomy   Diagnostic Studies History Nance Pew, CMA; 05/20/2018 10:49 AM) Colonoscopy  within last year  Allergies Gabriel Cirri Canty, CMA; 05/20/2018 10:50 AM) No Known Allergies [05/20/2018]: No Known Drug Allergies [05/20/2018]: Allergies Reconciled   Medication History Nance Pew, CMA; 05/20/2018 10:51 AM) Atorvastatin Calcium (20MG  Tablet, Oral) Active. valACYclovir HCl (1GM Tablet, Oral) Active. Lisinopril-hydroCHLOROthiazide (20-25MG  Tablet, Oral) Active. metFORMIN HCl (500MG  Tablet, Oral) Active. Cialis (2.5MG  Tablet, Oral) Active. Pantoprazole Sodium (40MG  For Solution, Intravenous) Active. Medications Reconciled  Social History Nance Pew, CMA; 05/20/2018 10:49 AM) Alcohol use  Occasional alcohol use. Caffeine use  Carbonated beverages, Coffee, Tea. No drug use  Tobacco use  Current some day smoker.  Family History Nance Pew, [place]; 05/20/2018 10:49  AM) Alcohol Abuse  Father. Cerebrovascular Accident  Father. Colon Cancer  Mother. Colon Polyps  Mother. Diabetes Mellitus  Father. Heart Disease  Father. Hypertension  Mother.  Other Problems Nance Pew, CMA; 05/20/2018 10:49 AM) Diabetes Mellitus  High blood pressure  Hypercholesterolemia     Review of Systems Ralene Ok MD; 05/20/2018 11:21 AM) General Present- Appetite Loss. Not Present- Chills, Fatigue, Fever, Night Sweats, Weight Gain and Weight Loss. Skin Not Present- Change in Wart/Mole, Dryness, Hives, Jaundice, New Lesions, Non-Healing Wounds, Rash and Ulcer. HEENT Not Present- Earache, Hearing Loss, Hoarseness, Nose Bleed, Oral Ulcers, Ringing in the Ears, Seasonal Allergies, Sinus Pain, Sore Throat, Visual Disturbances, Wears glasses/contact lenses and Yellow Eyes. Respiratory Present- Snoring. Not Present- Bloody sputum, Chronic Cough, Difficulty Breathing and Wheezing. Breast Not Present- Breast Mass, Breast Pain, Nipple Discharge and Skin Changes. Cardiovascular Not Present- Chest Pain, Difficulty Breathing Lying Down, Leg Cramps, Palpitations, Rapid Heart Rate, Shortness of Breath and Swelling of Extremities. Gastrointestinal Present- Abdominal Pain, Excessive gas and Hemorrhoids. Not Present- Bloating, Bloody Stool, Change in Bowel Habits, Chronic diarrhea, Constipation, Difficulty Swallowing, Gets full quickly at meals, Indigestion, Nausea, Rectal Pain and Vomiting. Male Genitourinary Not Present- Blood in Urine, Change in Urinary Stream, Frequency, Impotence, Nocturia, Painful Urination, Urgency and Urine Leakage. Musculoskeletal Not Present- Back Pain, Joint Pain, Joint Stiffness, Muscle Pain, Muscle Weakness and Swelling of Extremities. Neurological Not Present- Decreased Memory, Fainting, Headaches, Numbness, Seizures, Tingling, Tremor, Trouble walking and Weakness. Psychiatric Not Present- Anxiety, Bipolar, Change in Sleep Pattern, Depression, Fearful  and Frequent crying. Endocrine Not Present- Cold Intolerance, Excessive Hunger, Hair Changes, Heat Intolerance, Hot flashes and New Diabetes. Hematology Not Present- Blood Thinners, Easy Bruising, Excessive bleeding, Gland problems, HIV and Persistent Infections. All other systems negative  Vitals Gabriel Cirri Canty CMA; 05/20/2018 10:52  AM) 05/20/2018 10:51 AM Weight: 241 lb Height: 71in Body Surface Area: 2.28 m Body Mass Index: 33.61 kg/m  Temp.: 98.17F(Oral)  Pulse: 98 (Regular)  P.OX: 97% (Room air) BP: 158/92 (Sitting, Left Arm, Standard)       Physical Exam Ralene Ok MD; 05/20/2018 11:23 AM) The physical exam findings are as follows: Note:Constitutional: No acute distress, conversant, appears stated age  Eyes: Anicteric sclerae, moist conjunctiva, no lid lag  Neck: No thyromegaly, trachea midline, no cervical lymphadenopathy  Lungs: Clear to auscultation biilaterally, normal respiratory effot  Cardiovascular: regular rate & rhythm, no murmurs, no peripheal edema, pedal pulses 2+  GI: Soft, no masses or hepatosplenomegaly, non-tender to palpation, small umbilical hernia nontender to palpation.  MSK: Normal gait, no clubbing cyanosis, edema  Skin: No rashes, palpation reveals normal skin turgor  Psychiatric: Appropriate judgment and insight, oriented to person, place, and time    Assessment & Plan Ralene Ok MD; 05/20/2018 11:26 AM) SYMPTOMATIC CHOLELITHIASIS (K80.20) Impression: Patient is a 48 year old male with a history of diabetes, hypertension, comes in with lower abdominal pain. Patient was found to have a large gallstone ultrasound. Patient also appears to have an umbilical hernia on CT scan. Patient does have lower abdominal pain and not any signs consistent with symptomatic cholelithiasis. 1. We will order a HIDA scan to see if this persists states any similar pains. 2. I did discuss with him this could result of the hernia at the  umbilicus. 3. We'll call back with results and set up follow-up to discuss next. UMBILICAL HERNIA WITHOUT OBSTRUCTION OR GANGRENE (K42.9) Impression: As above

## 2018-07-28 ENCOUNTER — Emergency Department (HOSPITAL_COMMUNITY)
Admission: EM | Admit: 2018-07-28 | Discharge: 2018-07-28 | Disposition: A | Discharge status: Home / Self Care | Payer: 59 | Attending: Emergency Medicine | Admitting: Emergency Medicine

## 2018-07-28 ENCOUNTER — Encounter (HOSPITAL_COMMUNITY): Payer: Self-pay

## 2018-07-28 ENCOUNTER — Other Ambulatory Visit: Payer: Self-pay

## 2018-07-28 DIAGNOSIS — Z79899 Other long term (current) drug therapy: Secondary | ICD-10-CM | POA: Insufficient documentation

## 2018-07-28 DIAGNOSIS — F17210 Nicotine dependence, cigarettes, uncomplicated: Secondary | ICD-10-CM | POA: Diagnosis not present

## 2018-07-28 DIAGNOSIS — R109 Unspecified abdominal pain: Secondary | ICD-10-CM | POA: Diagnosis present

## 2018-07-28 DIAGNOSIS — Z7984 Long term (current) use of oral hypoglycemic drugs: Secondary | ICD-10-CM | POA: Diagnosis not present

## 2018-07-28 DIAGNOSIS — Z7982 Long term (current) use of aspirin: Secondary | ICD-10-CM | POA: Diagnosis not present

## 2018-07-28 DIAGNOSIS — Z20828 Contact with and (suspected) exposure to other viral communicable diseases: Secondary | ICD-10-CM | POA: Diagnosis not present

## 2018-07-28 DIAGNOSIS — M549 Dorsalgia, unspecified: Secondary | ICD-10-CM | POA: Insufficient documentation

## 2018-07-28 DIAGNOSIS — I10 Essential (primary) hypertension: Secondary | ICD-10-CM | POA: Insufficient documentation

## 2018-07-28 DIAGNOSIS — E119 Type 2 diabetes mellitus without complications: Secondary | ICD-10-CM | POA: Insufficient documentation

## 2018-07-28 LAB — LIPASE, BLOOD: Lipase: 28 U/L (ref 11–51)

## 2018-07-28 LAB — COMPREHENSIVE METABOLIC PANEL
ALT: 17 U/L (ref 0–44)
AST: 11 U/L — ABNORMAL LOW (ref 15–41)
Albumin: 3.9 g/dL (ref 3.5–5.0)
Alkaline Phosphatase: 63 U/L (ref 38–126)
Anion gap: 7 (ref 5–15)
BUN: 14 mg/dL (ref 6–20)
CO2: 28 mmol/L (ref 22–32)
Calcium: 8.8 mg/dL — ABNORMAL LOW (ref 8.9–10.3)
Chloride: 103 mmol/L (ref 98–111)
Creatinine, Ser: 1.02 mg/dL (ref 0.61–1.24)
GFR calc Af Amer: >60 mL/min (ref >60)
GFR calc non Af Amer: >60 mL/min (ref >60)
Glucose, Bld: 146 mg/dL — ABNORMAL HIGH (ref 70–99)
Potassium: 3.5 mmol/L (ref 3.5–5.1)
Sodium: 138 mmol/L (ref 135–145)
Total Bilirubin: 0.2 mg/dL — ABNORMAL LOW (ref 0.3–1.2)
Total Protein: 7.6 g/dL (ref 6.5–8.1)

## 2018-07-28 LAB — CBC WITH DIFFERENTIAL/PLATELET
Abs Immature Granulocytes: 0.02 10*3/uL (ref 0.00–0.07)
Basophils Absolute: 0 10*3/uL (ref 0.0–0.1)
Basophils Relative: 0 %
Eosinophils Absolute: 0.3 10*3/uL (ref 0.0–0.5)
Eosinophils Relative: 4 %
HCT: 39.3 % (ref 39.0–52.0)
Hemoglobin: 13 g/dL (ref 13.0–17.0)
Immature Granulocytes: 0 %
Lymphocytes Relative: 26 %
Lymphs Abs: 1.9 10*3/uL (ref 0.7–4.0)
MCH: 29 pg (ref 26.0–34.0)
MCHC: 33.1 g/dL (ref 30.0–36.0)
MCV: 87.5 fL (ref 80.0–100.0)
Monocytes Absolute: 0.7 10*3/uL (ref 0.1–1.0)
Monocytes Relative: 10 %
Neutro Abs: 4.3 10*3/uL (ref 1.7–7.7)
Neutrophils Relative %: 60 %
Platelets: 245 10*3/uL (ref 150–400)
RBC: 4.49 MIL/uL (ref 4.22–5.81)
RDW: 14 % (ref 11.5–15.5)
WBC: 7.3 10*3/uL (ref 4.0–10.5)
nRBC: 0 % (ref 0.0–0.2)

## 2018-07-28 LAB — URINALYSIS, ROUTINE W REFLEX MICROSCOPIC
Bilirubin Urine: NEGATIVE
Glucose, UA: NEGATIVE mg/dL
Hgb urine dipstick: NEGATIVE
Ketones, ur: NEGATIVE mg/dL
Leukocytes,Ua: NEGATIVE
Nitrite: NEGATIVE
Protein, ur: NEGATIVE mg/dL
Specific Gravity, Urine: 1.011 (ref 1.005–1.030)
pH: 6 (ref 5.0–8.0)

## 2018-07-28 LAB — TROPONIN I: Troponin I: <0.03 ng/mL (ref <0.03)

## 2018-07-28 NOTE — ED Triage Notes (Signed)
Patient c/o right flank pain since last night. Patient states he is scheduled for gall bladder surgery in 2 days.  Patient states he had a small amount of diarrhea this AM, but denies N/v. Patient denies any problems with urination.

## 2018-07-28 NOTE — ED Provider Notes (Signed)
[place] DEPT Provider Note   CSN: [id number] Arrival date & time: 07/28/18  1120    History   Chief Complaint Chief Complaint  Patient presents with  . Flank Pain    HPI Michael Harvey is a 48 y.o. male history of hypertension, acid reflux presenting to emergency department today with chief complaint of back pain x1 day.  Patient states he woke up from sleep at 3 AM this morning because of sharp pain in his back.  The pain is located under his right shoulder blade that he describes as sharp.  He also has constant aching pain radiating down his right side.  He admits to 1 episode of loose stool this morning. He denies any fever, chills, nausea, vomiting, urinary symptoms, blood in stool.  Pt is scheduled for cholecystectomy with Dr. Rosendo Gros in 3 days.  He states he called his office this morning when the pain started who recommended he come to the emergency room for further evaluation.  Chart review shows patient had nuclear medicine hepatobiliary imaging on 05/22/2018 that shows nonfilling of the gallbladder concerning for cystic duct obstruction.  Denies fevers, weight loss, numbness/weakness of upper and lower extremities, bowel/bladder incontinence, urinary retention, history of cancer, saddle anesthesia, history of back surgery, history of IVDA.   Past Medical History:  Diagnosis Date  . Acid reflux   . Cluster headaches   . Genital herpes   . Hypertension     Patient Active Problem List   Diagnosis Date Noted  . Dizziness 05/12/2014  . Abnormal EKG 05/12/2014  . Atypical chest pain 01/25/2013    Past Surgical History:  Procedure Laterality Date  . VASECTOMY          Home Medications    Prior to Admission medications   Medication Sig Start Date End Date Taking? Authorizing Provider  atorvastatin (LIPITOR) 20 MG tablet Take 20 mg by mouth daily.   Yes [provider]  CHANTIX 1 MG tablet Take 1 mg by mouth 2 (two) times  daily. 04/17/18  Yes [provider]  CIALIS 20 MG tablet Take 20 mg by mouth daily as needed for erectile dysfunction.  05/26/17  Yes [provider]  lisinopril (PRINIVIL,ZESTRIL) 20 MG tablet Take 20 mg by mouth daily.   Yes [provider]  metFORMIN (GLUCOPHAGE) 500 MG tablet Take 500 mg by mouth daily with breakfast.   Yes [provider]  pantoprazole (PROTONIX) 40 MG tablet Take 40 mg by mouth 2 (two) times daily. 04/29/18  Yes [provider]  valACYclovir (VALTREX) 1000 MG tablet Take 1 g by mouth daily. 05/04/17  Yes [provider]  albuterol (PROVENTIL HFA;VENTOLIN HFA) 108 (90 BASE) MCG/ACT inhaler Inhale 1-2 puffs into the lungs every 4 (four) hours as needed for wheezing or shortness of breath. Patient not taking: Reported on 06/01/2017 11/01/13   Linton Flemings, MD  aspirin 81 MG chewable tablet Chew 1 tablet (81 mg total) by mouth daily. Patient not taking: Reported on 06/01/2017 05/12/14   Sherwood Gambler, MD  benzonatate (TESSALON) 200 MG capsule Take 1 capsule (200 mg total) by mouth 3 (three) times daily as needed for cough. Patient not taking: Reported on 05/12/2014 11/01/13   Linton Flemings, MD  chlorpheniramine-HYDROcodone [facility] PENNKINETIC ER) 10-8 MG/5ML [facility] Take 5 mLs by mouth every 12 (twelve) hours as needed for cough. Patient not taking: Reported on 05/12/2014 11/01/13   Linton Flemings, MD  famotidine (PEPCID) 20 MG tablet Take 1 tablet (  20 mg total) by mouth 2 (two) times daily. Patient not taking: Reported on 07/28/2018 02/11/18   Carmin Muskrat, MD  hydrochlorothiazide (HYDRODIURIL) 25 MG tablet Take 0.5 tablets (12.5 mg total) by mouth daily. Patient not taking: Reported on 05/12/2014 01/25/13   Pamella Pert, MD  omeprazole (PRILOSEC) 20 MG capsule Take 1 capsule (20 mg total) by mouth daily. Patient not taking: Reported on 07/28/2018 06/01/17   Horton, Barbette Hair, MD  sucralfate (CARAFATE) 1 g tablet Take 1 tablet (1 g  total) by mouth 4 (four) times daily -  with meals and at bedtime. Patient not taking: Reported on 07/28/2018 02/11/18   Carmin Muskrat, MD    Family History Family History  Problem Relation Age of Onset  . Hypertension Mother   . Cancer Mother   . Stroke Father   . Hypertension Father   . Diabetes Father   . Heart failure Father     Social History Social History   Tobacco Use  . Smoking status: Current Every Day Smoker    Packs/day: 0.50    Types: Cigarettes  . Smokeless tobacco: Never Used  Substance Use Topics  . Alcohol use: Yes    Comment: occasional   . Drug use: No     Allergies   Patient has no known allergies.   Review of Systems Review of Systems  Constitutional: Negative for chills and fever.  HENT: Negative for congestion, rhinorrhea, sinus pressure and sore throat.   Eyes: Negative for pain and redness.  Respiratory: Negative for cough, shortness of breath and wheezing.   Cardiovascular: Negative for chest pain and palpitations.  Gastrointestinal: Positive for diarrhea. Negative for abdominal pain, constipation, nausea and vomiting.  Genitourinary: Negative for dysuria.  Musculoskeletal: Positive for back pain. Negative for arthralgias, myalgias and neck pain.  Skin: Negative for rash and wound.  Neurological: Negative for dizziness, syncope, weakness, numbness and headaches.  Psychiatric/Behavioral: Negative for confusion.     Physical Exam Updated Vital Signs BP (!) 141/91   Pulse 66   Temp 97.8 F (36.6 C) (Oral)   Resp 16   Ht 5\' 10"  (1.778 m)   Wt 108.9 kg   SpO2 99%   BMI 34.44 kg/m   Physical Exam Vitals signs and nursing note reviewed.  Constitutional:      General: He is not in acute distress.    Appearance: He is not ill-appearing.  HENT:     Head: Normocephalic and atraumatic.     Right Ear: Tympanic membrane and external ear normal.     Left Ear: Tympanic membrane and external ear normal.     Nose: Nose normal.      Mouth/Throat:     Mouth: Mucous membranes are moist.     Pharynx: Oropharynx is clear.  Eyes:     General: No scleral icterus.       Right eye: No discharge.        Left eye: No discharge.     Extraocular Movements: Extraocular movements intact.     Conjunctiva/sclera: Conjunctivae normal.     Pupils: Pupils are equal, round, and reactive to light.  Neck:     Musculoskeletal: Normal range of motion.     Vascular: No JVD.  Cardiovascular:     Rate and Rhythm: Normal rate and regular rhythm.     Pulses: Normal pulses.          Radial pulses are 2+ on the right side and 2+ on the left side.  Heart sounds: Normal heart sounds.  Pulmonary:     Comments: Lungs clear to auscultation in all fields. Symmetric chest rise. No wheezing, rales, or rhonchi. Abdominal:     Comments: Abdomen is soft, non-distended, and non-tender in all quadrants. No rigidity, no guarding. No peritoneal signs.  Musculoskeletal: Normal range of motion.       Arms:     Comments: Full range of motion of the thoracic spine and lumbar spine with flexion, hyperextension, and lateral flexion. No midline tenderness or stepoffs. No tenderness to palpation of the spinous processes of the thoracic spine or lumbar spine. No tenderness to palpation of the paraspinous muscles of lumbar spine   Skin:    General: Skin is warm and dry.     Capillary Refill: Capillary refill takes less than 2 seconds.  Neurological:     Mental Status: He is oriented to person, place, and time.     GCS: GCS eye subscore is 4. GCS verbal subscore is 5. GCS motor subscore is 6.     Comments: Sensation grossly intact to light touch in the lower extremities bilaterally. No saddle anesthesias. Strength 5/5 with flexion and extension at the bilateral hips, knees, and ankles. No noted gait deficit. Coordination intact with heel to shin testing.  Psychiatric:        Behavior: Behavior normal.      ED Treatments / Results  Labs (all labs  ordered are listed, but only abnormal results are displayed) Labs Reviewed  URINALYSIS, ROUTINE W REFLEX MICROSCOPIC - Abnormal; Notable for the following components:      Result Value   Color, Urine STRAW (*)    All other components within normal limits  COMPREHENSIVE METABOLIC PANEL - Abnormal; Notable for the following components:   Glucose, Bld 146 (*)    Calcium 8.8 (*)    AST 11 (*)    Total Bilirubin 0.2 (*)    All other components within normal limits  NOVEL CORONAVIRUS, NAA (HOSPITAL ORDER, SEND-OUT TO REF LAB)  CBC WITH DIFFERENTIAL/PLATELET  TROPONIN I  LIPASE, BLOOD    EKG None  Radiology No results found.  Procedures Procedures (including critical care time)  Medications Ordered in ED Medications - No data to display   Initial Impression / Assessment and Plan / ED Course  I have reviewed the triage vital signs and the nursing notes.  Pertinent labs & imaging results that were available during my care of the patient were reviewed by me and considered in my medical decision making (see chart for details).  48 year old male presents with back pain.  He is overall well-appearing, no acute distress.  Tenderness to palpation under right scapula.  No red flags for his back pain. Ambulates with steady gait. Patient has had similar back pain and is scheduled for a cholecystectomy on 07/31/2018.  Labs overall are unremarkable including CMP, CBC, lipase.  Urine without signs of infection.  Troponin is negative.  Patient is declining pain medication while in the emergency department.  Send out COVID test performed as patient was supposed to have it done in office today as part of his preop screening.    Doubt need for further emergent work up at this time. I explained the diagnosis and have given explicit precautions to return to the ER including for any other new or worsening symptoms. The patient understands and accepts the medical plan as it's been dictated and I have  answered their questions. Discharge instructions concerning home care and prescriptions have  been given. The patient is stable and is discharged to home in good condition. The patient was discussed with and seen by Dr. Ashok Cordia who agrees with the treatment plan.   Final Clinical Impressions(s) / ED Diagnoses   Final diagnoses:  Acute right-sided back pain, unspecified back location    ED Discharge Orders    None       Cherre Robins, PA-C 07/28/18 1417    Lajean Saver, MD 07/28/18 1524

## 2018-07-28 NOTE — Discharge Instructions (Addendum)
You have been seen today for side pain. Please read and follow all provided instructions. Return to the emergency room for worsening condition or new concerning symptoms.    Your coronavirus test is pending. Results should be back in 2-3 days.  1. Medications:  -Take Tylenol for your pain.  Take as directed on the bottle.  You can also try putting a heating pad on your back to help with the pain. Continue usual home medications Take medications as prescribed. Please review all of the medicines and only take them if you do not have an allergy to them.  2. Treatment: rest, drink plenty of fluids  3. Follow Up: Please follow up with your primary doctor in 2-5 days for discussion of your diagnoses and further evaluation after today's visit; Call today to arrange your follow up.    Good luck with your surgery  ?

## 2018-07-29 LAB — NOVEL CORONAVIRUS, NAA (HOSP ORDER, SEND-OUT TO REF LAB; TAT 18-24 HRS): SARS-CoV-2, NAA: NOT DETECTED

## 2020-05-29 ENCOUNTER — Ambulatory Visit
Admission: EM | Admit: 2020-05-29 | Discharge: 2020-05-29 | Disposition: A | Discharge status: Home / Self Care | Payer: 59 | Attending: Emergency Medicine | Admitting: Emergency Medicine

## 2020-05-29 ENCOUNTER — Encounter: Payer: Self-pay | Admitting: Emergency Medicine

## 2020-05-29 ENCOUNTER — Other Ambulatory Visit: Payer: Self-pay

## 2020-05-29 DIAGNOSIS — L02416 Cutaneous abscess of left lower limb: Secondary | ICD-10-CM

## 2020-05-29 MED ORDER — DOXYCYCLINE HYCLATE 100 MG PO CAPS: 100.0000 mg | ORAL_CAPSULE | Freq: Two times a day (BID) | ORAL | 0 refills | Status: AC

## 2020-05-29 NOTE — ED Triage Notes (Signed)
Patient presents to Urgent Care with complaints of possible insect bite of left thigh 2 days ago . Patient reports area size of thumb. Did not see insect of the area. Applied cold and hot compress to the area.

## 2020-05-29 NOTE — Discharge Instructions (Signed)
Begin doxycycline twice daily Tylenol for pain Warm compresses to area Please follow-up if not improving or worsening

## 2020-05-29 NOTE — ED Provider Notes (Signed)
[facility]    CSN: [id number] Arrival date & time: 05/29/20  1058      History   Chief Complaint Chief Complaint  Patient presents with  . Insect Bite    HPI Michael Harvey is a 50 y.o. male history of hypertension, GERD presenting today for evaluation of possible insect bite.  Reports noticed area of redness and swelling to left thigh approximately 2 days ago.  Has been using hot and cold compresses.  Reports history of abscesses, but this feels different.  Reports symptoms began after he was working underneath a house recently and is concerned about possible insect bite.  He denies any fevers.  Denies lymph node swelling.  Denies any urinary changes.  HPI  Past Medical History:  Diagnosis Date  . Acid reflux   . Cluster headaches   . Genital herpes   . Hypertension     Patient Active Problem List   Diagnosis Date Noted  . Dizziness 05/12/2014  . Abnormal EKG 05/12/2014  . Atypical chest pain 01/25/2013    Past Surgical History:  Procedure Laterality Date  . VASECTOMY         Home Medications    Prior to Admission medications   Medication Sig Start Date End Date Taking? Authorizing Provider  atorvastatin (LIPITOR) 20 MG tablet Take 20 mg by mouth daily.   Yes [provider]  CIALIS 20 MG tablet Take 20 mg by mouth daily as needed for erectile dysfunction.  05/26/17  Yes [provider]  doxycycline (VIBRAMYCIN) 100 MG capsule Take 1 capsule (100 mg total) by mouth 2 (two) times daily for 10 days. 05/29/20 06/08/20 Yes Kennidy Lamke C, PA-C  lisinopril (PRINIVIL,ZESTRIL) 20 MG tablet Take 20 mg by mouth daily.   Yes [provider]  metFORMIN (GLUCOPHAGE) 500 MG tablet Take 500 mg by mouth daily with breakfast.   Yes [provider]  pantoprazole (PROTONIX) 40 MG tablet Take 40 mg by mouth 2 (two) times daily. 04/29/18  Yes [provider]  CHANTIX 1 MG tablet Take 1 mg by mouth 2 (two) times daily.  04/17/18   [provider]  sucralfate (CARAFATE) 1 g tablet Take 1 tablet (1 g total) by mouth 4 (four) times daily -  with meals and at bedtime. Patient not taking: Reported on 07/28/2018 02/11/18   Carmin Muskrat, MD  valACYclovir (VALTREX) 1000 MG tablet Take 1 g by mouth daily. 05/04/17   [provider]  albuterol (PROVENTIL HFA;VENTOLIN HFA) 108 (90 BASE) MCG/ACT inhaler Inhale 1-2 puffs into the lungs every 4 (four) hours as needed for wheezing or shortness of breath. Patient not taking: Reported on 06/01/2017 11/01/13 05/29/20  Linton Flemings, MD  famotidine (PEPCID) 20 MG tablet Take 1 tablet (20 mg total) by mouth 2 (two) times daily. Patient not taking: Reported on 07/28/2018 02/11/18 05/29/20  Carmin Muskrat, MD  hydrochlorothiazide (HYDRODIURIL) 25 MG tablet Take 0.5 tablets (12.5 mg total) by mouth daily. Patient not taking: Reported on 05/12/2014 01/25/13 05/29/20  Pamella Pert, MD  omeprazole (PRILOSEC) 20 MG capsule Take 1 capsule (20 mg total) by mouth daily. Patient not taking: Reported on 07/28/2018 06/01/17 05/29/20  Horton, Barbette Hair, MD    Family History Family History  Problem Relation Age of Onset  . Hypertension Mother   . Cancer Mother   . Stroke Father   . Hypertension Father   . Diabetes Father   . Heart failure Father     Social History Social  History   Tobacco Use  . Smoking status: Current Every Day Smoker    Packs/day: 0.50    Types: Cigarettes  . Smokeless tobacco: Never Used  Vaping Use  . Vaping Use: Never used  Substance Use Topics  . Alcohol use: Yes    Comment: occasional   . Drug use: No     Allergies   Patient has no known allergies.   Review of Systems Review of Systems  Constitutional: Negative for fatigue and fever.  Eyes: Negative for redness, itching and visual disturbance.  Respiratory: Negative for shortness of breath.   Cardiovascular: Negative for chest pain and leg swelling.  Gastrointestinal: Negative for  nausea and vomiting.  Musculoskeletal: Negative for arthralgias and myalgias.  Skin: Positive for color change and rash. Negative for wound.  Neurological: Negative for dizziness, syncope, weakness, light-headedness and headaches.     Physical Exam Triage Vital Signs ED Triage Vitals  Enc Vitals Group     BP 05/29/20 1135 (!) 158/96     Pulse Rate 05/29/20 1135 74     Resp 05/29/20 1135 16     Temp 05/29/20 1135 98.8 F (37.1 C)     Temp Source 05/29/20 1135 Oral     SpO2 05/29/20 1135 96 %     Weight --      Height --      Head Circumference --      Peak Flow --      Pain Score 05/29/20 1133 4     Pain Loc --      Pain Edu? --      Excl. in [place]? --    No data found.  Updated Vital Signs BP (!) 158/96 (BP Location: Left Arm)   Pulse 74   Temp 98.8 F (37.1 C) (Oral)   Resp 16   SpO2 96%   Visual Acuity Right Eye Distance:   Left Eye Distance:   Bilateral Distance:    Right Eye Near:   Left Eye Near:    Bilateral Near:     Physical Exam Vitals and nursing note reviewed.  Constitutional:      Appearance: He is well-developed.     Comments: No acute distress  HENT:     Head: Normocephalic and atraumatic.     Nose: Nose normal.  Eyes:     Conjunctiva/sclera: Conjunctivae normal.  Cardiovascular:     Rate and Rhythm: Normal rate.  Pulmonary:     Effort: Pulmonary effort is normal. No respiratory distress.  Abdominal:     General: There is no distension.  Musculoskeletal:        General: Normal range of motion.     Cervical back: Neck supple.  Skin:    General: Skin is warm and dry.     Comments: Left proximal thigh with area of erythema and central mild fluctuant with slight purplish discoloration and oozing blood, mild tenderness to palpation  No palpable associated lymphadenopathy  Neurological:     Mental Status: He is alert and oriented to person, place, and time.      UC Treatments / Results  Labs (all labs ordered are listed, but only  abnormal results are displayed) Labs Reviewed - No data to display  EKG   Radiology No results found.  Procedures Procedures (including critical care time)  Medications Ordered in UC Medications - No data to display  Initial Impression / Assessment and Plan / UC Course  I have reviewed the triage vital signs and  the nursing notes.  Pertinent labs & imaging results that were available during my care of the patient were reviewed by me and considered in my medical decision making (see chart for details).     Appears to have localized cellulitis/superficial abscess to area, placing on doxycycline, recommending warm compresses and anti-inflammatories for pain.  Deferring I&D at this time.  Fluctuance relatively superficial and already slightly draining.,  Would expect continuous spontaneous drainage.  Discussed strict return precautions. Patient verbalized understanding and is agreeable with plan.  Final Clinical Impressions(s) / UC Diagnoses   Final diagnoses:  Abscess of left thigh     Discharge Instructions     Begin doxycycline twice daily Tylenol for pain Warm compresses to area Please follow-up if not improving or worsening    ED Prescriptions    Medication Sig Dispense Auth. Provider   doxycycline (VIBRAMYCIN) 100 MG capsule Take 1 capsule (100 mg total) by mouth 2 (two) times daily for 10 days. 20 capsule Layla Kesling, [place] C, PA-C     PDMP not reviewed this encounter.   Janith Lima, PA-C 05/29/20 1313

## 2023-01-14 ENCOUNTER — Other Ambulatory Visit: Payer: Self-pay | Admitting: Gastroenterology

## 2023-01-14 DIAGNOSIS — K3189 Other diseases of stomach and duodenum: Secondary | ICD-10-CM

## 2023-01-30 ENCOUNTER — Other Ambulatory Visit: Payer: 59

## 2023-02-20 ENCOUNTER — Other Ambulatory Visit: Payer: 59

## 2024-01-18 ENCOUNTER — Other Ambulatory Visit: Payer: Self-pay

## 2024-01-18 ENCOUNTER — Encounter (HOSPITAL_BASED_OUTPATIENT_CLINIC_OR_DEPARTMENT_OTHER): Payer: Self-pay

## 2024-01-18 ENCOUNTER — Emergency Department (HOSPITAL_BASED_OUTPATIENT_CLINIC_OR_DEPARTMENT_OTHER)
Admission: EM | Admit: 2024-01-18 | Discharge: 2024-01-18 | Disposition: A | Discharge status: Home / Self Care | Attending: Emergency Medicine | Admitting: Emergency Medicine

## 2024-01-18 DIAGNOSIS — I1A0 Resistant hypertension: Secondary | ICD-10-CM | POA: Insufficient documentation

## 2024-01-18 DIAGNOSIS — Z87891 Personal history of nicotine dependence: Secondary | ICD-10-CM | POA: Insufficient documentation

## 2024-01-18 DIAGNOSIS — I10 Essential (primary) hypertension: Secondary | ICD-10-CM | POA: Diagnosis present

## 2024-01-18 DIAGNOSIS — Z79899 Other long term (current) drug therapy: Secondary | ICD-10-CM | POA: Insufficient documentation

## 2024-01-18 LAB — CBC WITH DIFFERENTIAL/PLATELET
Abs Immature Granulocytes: 0.01 K/uL (ref 0.00–0.07)
Basophils Absolute: 0.1 K/uL (ref 0.0–0.1)
Basophils Relative: 1 %
Eosinophils Absolute: 0.3 K/uL (ref 0.0–0.5)
Eosinophils Relative: 4 %
HCT: 35.3 % — ABNORMAL LOW (ref 39.0–52.0)
Hemoglobin: 11.5 g/dL — ABNORMAL LOW (ref 13.0–17.0)
Immature Granulocytes: 0 %
Lymphocytes Relative: 47 %
Lymphs Abs: 3 K/uL (ref 0.7–4.0)
MCH: 28.5 pg (ref 26.0–34.0)
MCHC: 32.6 g/dL (ref 30.0–36.0)
MCV: 87.4 fL (ref 80.0–100.0)
Monocytes Absolute: 0.6 K/uL (ref 0.1–1.0)
Monocytes Relative: 10 %
Neutro Abs: 2.5 K/uL (ref 1.7–7.7)
Neutrophils Relative %: 38 %
Platelets: 283 K/uL (ref 150–400)
RBC: 4.04 MIL/uL — ABNORMAL LOW (ref 4.22–5.81)
RDW: 13.5 % (ref 11.5–15.5)
WBC: 6.4 K/uL (ref 4.0–10.5)
nRBC: 0 % (ref 0.0–0.2)

## 2024-01-18 LAB — URINALYSIS, ROUTINE W REFLEX MICROSCOPIC
Bilirubin Urine: NEGATIVE
Glucose, UA: NEGATIVE mg/dL
Hgb urine dipstick: NEGATIVE
Ketones, ur: NEGATIVE mg/dL
Leukocytes,Ua: NEGATIVE
Nitrite: NEGATIVE
Protein, ur: NEGATIVE mg/dL
Specific Gravity, Urine: 1.01 (ref 1.005–1.030)
pH: 7 (ref 5.0–8.0)

## 2024-01-18 LAB — BASIC METABOLIC PANEL WITH GFR
Anion gap: 8 (ref 5–15)
BUN: 14 mg/dL (ref 6–20)
CO2: 31 mmol/L (ref 22–32)
Calcium: 9 mg/dL (ref 8.9–10.3)
Chloride: 101 mmol/L (ref 98–111)
Creatinine, Ser: 1.04 mg/dL (ref 0.61–1.24)
GFR, Estimated: >60 mL/min (ref >60)
Glucose, Bld: 99 mg/dL (ref 70–99)
Potassium: 3.4 mmol/L — ABNORMAL LOW (ref 3.5–5.1)
Sodium: 141 mmol/L (ref 135–145)

## 2024-01-18 MED ORDER — AMLODIPINE BESYLATE 5 MG PO TABS
5.0000 mg | ORAL_TABLET | Freq: Once | ORAL | Status: DC
  Filled 2024-01-18: qty 1

## 2024-01-18 MED ORDER — AMLODIPINE BESYLATE 5 MG PO TABS: 5.0000 mg | ORAL_TABLET | Freq: Every day | ORAL | 3 refills | Status: AC

## 2024-01-18 NOTE — ED Provider Notes (Signed)
 [facility] Provider Note   CSN: [id number] Arrival date & time: 01/18/24  1731     Patient presents with: Hypertension   Michael Harvey is a 53 y.o. male.  He has a history of hypertension.  He said they took him off his lisinopril and put him on olmesartan HCTZ back in June because his blood pressure was creeping up.  He said it worked okay for a month or 2 but his blood pressure has slowly been creeping up again.  He saw his doctor last week and they felt his cuff was too small but they wanted him to keep a record of his blood pressures to see if he needs any adjustment.  Said his blood pressures have still been running over 200.  No specific complaints but says he sometimes gets some pressure in his head.  No blurry vision double vision chest pain shortness of breath leg swelling.  Smokes infrequently quitting, no significant alcohol use, no stimulants or drugs.  States he does probably eat too much salt.  {Add pertinent medical, surgical, social history, OB history to YEP:67052} The history is provided by the patient.  Hypertension This is a chronic problem. The problem has not changed since onset.Associated symptoms include headaches. Pertinent negatives include no chest pain, no abdominal pain and no shortness of breath. Nothing aggravates the symptoms. Nothing relieves the symptoms. He has tried rest for the symptoms. The treatment provided no relief.       Prior to Admission medications   Medication Sig Start Date End Date Taking? Authorizing Provider  atorvastatin (LIPITOR) 20 MG tablet Take 20 mg by mouth daily.    [provider]  CHANTIX 1 MG tablet Take 1 mg by mouth 2 (two) times daily. 04/17/18   [provider]  CIALIS 20 MG tablet Take 20 mg by mouth daily as needed for erectile dysfunction.  05/26/17   [provider]  lisinopril (PRINIVIL,ZESTRIL) 20 MG tablet Take 20 mg by mouth daily.    [provider]  metFORMIN (GLUCOPHAGE) 500 MG tablet Take 500 mg by mouth daily with breakfast.    [provider]  pantoprazole (PROTONIX) 40 MG tablet Take 40 mg by mouth 2 (two) times daily. 04/29/18   [provider]  sucralfate  (CARAFATE ) 1 g tablet Take 1 tablet (1 g total) by mouth 4 (four) times daily -  with meals and at bedtime. Patient not taking: Reported on 07/28/2018 02/11/18   Garrick Charleston, MD  valACYclovir (VALTREX) 1000 MG tablet Take 1 g by mouth daily. 05/04/17   [provider]  albuterol  (PROVENTIL  HFA;VENTOLIN  HFA) 108 (90 BASE) MCG/ACT inhaler Inhale 1-2 puffs into the lungs every 4 (four) hours as needed for wheezing or shortness of breath. Patient not taking: Reported on 06/01/2017 11/01/13 05/29/20  Saul Copping, MD  famotidine  (PEPCID ) 20 MG tablet Take 1 tablet (20 mg total) by mouth 2 (two) times daily. Patient not taking: Reported on 07/28/2018 02/11/18 05/29/20  Garrick Charleston, MD  hydrochlorothiazide  (HYDRODIURIL ) 25 MG tablet Take 0.5 tablets (12.5 mg total) by mouth daily. Patient not taking: Reported on 05/12/2014 01/25/13 05/29/20  Margrette Pear, MD  omeprazole  (PRILOSEC) 20 MG capsule Take 1 capsule (20 mg total) by mouth daily. Patient not taking: Reported on 07/28/2018 06/01/17 05/29/20  Horton, Charmaine FALCON, MD    Allergies: Patient has no known allergies.    Review of Systems  Constitutional:  Negative for fever.  Eyes:  Negative for visual disturbance.  Respiratory:  Negative for shortness of breath.   Cardiovascular:  Negative for chest pain.  Gastrointestinal:  Negative for abdominal pain.  Neurological:  Positive for headaches.    Updated Vital Signs BP (!) 192/111   Pulse 68   Temp 98.5 F (36.9 C) (Oral)   Resp 17   Ht 5' 11 (1.803 m)   Wt 104.3 kg   SpO2 100%   BMI 32.08 kg/m   Physical Exam Vitals and nursing note reviewed.  Constitutional:      Appearance: He is well-developed.  HENT:     Head:  Normocephalic and atraumatic.  Eyes:     Conjunctiva/sclera: Conjunctivae normal.  Cardiovascular:     Rate and Rhythm: Normal rate and regular rhythm.     Heart sounds: No murmur heard. Pulmonary:     Effort: Pulmonary effort is normal. No respiratory distress.     Breath sounds: Normal breath sounds.  Abdominal:     Palpations: Abdomen is soft.     Tenderness: There is no abdominal tenderness. There is no guarding or rebound.  Musculoskeletal:        General: No deformity.     Cervical back: Neck supple.  Skin:    General: Skin is warm and dry.  Neurological:     General: No focal deficit present.     Mental Status: He is alert.     GCS: GCS eye subscore is 4. GCS verbal subscore is 5. GCS motor subscore is 6.     (all labs ordered are listed, but only abnormal results are displayed) Labs Reviewed  BASIC METABOLIC PANEL WITH GFR  CBC WITH DIFFERENTIAL/PLATELET  URINALYSIS, ROUTINE W REFLEX MICROSCOPIC    EKG: None  Radiology: No results found.  {Document cardiac monitor, telemetry assessment procedure when appropriate:32947} Procedures   Medications Ordered in the ED - No data to display    {Click here for ABCD2, HEART and other calculators REFRESH Note before signing:1}                              Medical Decision Making Amount and/or Complexity of Data Reviewed Labs: ordered.   This patient complains of ***; this involves an extensive number of treatment Options and is a complaint that carries with it a high risk of complications and morbidity. The differential includes ***  I ordered, reviewed and interpreted labs, which included *** I ordered medication *** and reviewed PMP when indicated. I ordered imaging studies which included *** and I independently    visualized and interpreted imaging which showed *** Additional history obtained from *** Previous records obtained and reviewed *** I consulted *** and discussed lab and imaging findings and  discussed disposition.  Cardiac monitoring reviewed, *** Social determinants considered, *** Critical Interventions: ***  After the interventions stated above, I reevaluated the patient and found *** Admission and further testing considered, ***   {Document critical care time when appropriate  Document review of labs and clinical decision tools ie CHADS2VASC2, etc  Document your independent review of radiology images and any outside records  Document your discussion with family members, caretakers and with consultants  Document social determinants of health affecting pt's care  Document your decision making why or why not admission, treatments were needed:32947:::1}   Final diagnoses:  None    ED Discharge Orders     None

## 2024-01-18 NOTE — ED Triage Notes (Signed)
 Pt reports HTN 200s/110s. PCP told him to monitor for 2 weeks and then come back. Pt has been compliant with BP meds.
